# Patient Record
Sex: Female | Born: 1940 | Race: White | Hispanic: No | Marital: Married | Smoking: Current every day smoker
Health system: Southern US, Community
[De-identification: ages and names within clinical notes are randomized; demographics above are authoritative.]

## PROBLEM LIST (undated history)

## (undated) DIAGNOSIS — E039 Hypothyroidism, unspecified: Secondary | ICD-10-CM

## (undated) DIAGNOSIS — Z9981 Dependence on supplemental oxygen: Secondary | ICD-10-CM

## (undated) DIAGNOSIS — J189 Pneumonia, unspecified organism: Secondary | ICD-10-CM

## (undated) DIAGNOSIS — M1990 Unspecified osteoarthritis, unspecified site: Secondary | ICD-10-CM

## (undated) DIAGNOSIS — J449 Chronic obstructive pulmonary disease, unspecified: Secondary | ICD-10-CM

## (undated) DIAGNOSIS — F329 Major depressive disorder, single episode, unspecified: Secondary | ICD-10-CM

## (undated) DIAGNOSIS — F32A Depression, unspecified: Secondary | ICD-10-CM

## (undated) HISTORY — PX: EYE SURGERY: SHX253

## (undated) HISTORY — PX: NASAL SINUS SURGERY: SHX719

## (undated) HISTORY — PX: ABDOMINAL HYSTERECTOMY: SHX81

## (undated) HISTORY — PX: APPENDECTOMY: SHX54

## (undated) HISTORY — PX: BACK SURGERY: SHX140

---

## 1999-01-19 ENCOUNTER — Encounter: Payer: Self-pay | Admitting: Neurological Surgery

## 1999-01-19 ENCOUNTER — Ambulatory Visit (HOSPITAL_COMMUNITY): Admission: RE | Admit: 1999-01-19 | Discharge: 1999-01-19 | Payer: Self-pay | Admitting: Neurological Surgery

## 1999-03-29 ENCOUNTER — Ambulatory Visit (HOSPITAL_COMMUNITY): Admission: RE | Admit: 1999-03-29 | Discharge: 1999-03-29 | Payer: Self-pay | Admitting: Neurological Surgery

## 1999-03-29 ENCOUNTER — Encounter: Payer: Self-pay | Admitting: Neurological Surgery

## 1999-12-13 ENCOUNTER — Encounter: Admission: RE | Admit: 1999-12-13 | Discharge: 1999-12-13 | Payer: Self-pay | Admitting: Neurological Surgery

## 1999-12-13 ENCOUNTER — Encounter: Payer: Self-pay | Admitting: Neurological Surgery

## 1999-12-21 ENCOUNTER — Encounter: Payer: Self-pay | Admitting: Neurological Surgery

## 1999-12-25 ENCOUNTER — Encounter: Payer: Self-pay | Admitting: Neurological Surgery

## 1999-12-25 ENCOUNTER — Inpatient Hospital Stay (HOSPITAL_COMMUNITY): Admission: RE | Admit: 1999-12-25 | Discharge: 1999-12-26 | Payer: Self-pay | Admitting: Neurological Surgery

## 2005-06-07 ENCOUNTER — Emergency Department (HOSPITAL_COMMUNITY): Admission: EM | Admit: 2005-06-07 | Discharge: 2005-06-07 | Payer: Self-pay | Admitting: Emergency Medicine

## 2008-08-28 ENCOUNTER — Ambulatory Visit (HOSPITAL_COMMUNITY): Admission: RE | Admit: 2008-08-28 | Discharge: 2008-08-28 | Payer: Self-pay | Admitting: Orthopedic Surgery

## 2011-02-06 NOTE — Op Note (Signed)
Pamela Guerrero, Pamela Guerrero                 ACCOUNT NO.:  [id number]   MEDICAL RECORD NO.:  [id number]          PATIENT TYPE:  AMB   LOCATION:  SDS                          FACILITY:  [facility]   PHYSICIAN:  Artist Pais. Weingold, M.D.DATE OF BIRTH:  Aug 27, 1941   DATE OF PROCEDURE:  08/28/2008  DATE OF DISCHARGE:                               OPERATIVE REPORT   PREOPERATIVE DIAGNOSIS:  __________.   POSTOPERATIVE DIAGNOSIS:  __________.   PROCEDURE:  Left index finger flexor synovectomy with A1 pulley release.   SURGEON:  Artist Pais. Mina Marble, MD   ASSISTANT:  None.   ANESTHESIA:  Regional.   TOURNIQUET TIME:  35 minutes.   COMPLICATIONS:  None.   DRAINS:  None.   SPECIMEN:  No specimen sent off.   REPORT:  The patient was taken to the operating suite after induction of  adequate regional anesthetic.  Left upper extremity was prepped and  draped in usual sterile fashion.  Once this was done, a Brunner incision  was made from the mid palm to the level of proximal phalanx.  Skin was  incised, flaps were raised accordingly and dissection was carried down  through the flexor sheath.  Neurovascular bundle was identified and  retracted.  There was significant amount of synovitis about the flexor  sheath.  This was debrided.  The A1 pulley was released and the sheath  between the A1 pulley to the PIP flexion crease was debrided of  obstructive synovium.  This was sent to pathology for confirmation.  The  finger was gently manipulatedintofull flexion and full extension.  Wound  was irrigated and loosely closed with a 4-0 nylon suture.  Xeroform,  4x4s, and compressive wrap was applied.  The patient tolerated the  procedure well and taken to recovery in stable fashion.      Artist Pais Mina Marble, M.D.  Electronically Signed     MAW/MEDQ  D:  08/28/2008  T:  08/28/2008  Job:  [id number]

## 2011-02-09 NOTE — H&P (Signed)
[place]. [facility]  Patient:    Pamela, Guerrero                        MRN: [id number] Adm. Date:  [id number] Attending:  Jonne Ply                         History and Physical  ADMISSION DIAGNOSIS:  Cervical spondylosis with radiculopathy.  CHIEF COMPLAINT:  Neck and right upper extremity pain.  HISTORY OF PRESENT ILLNESS:  [name] is a 70 year old individual whom I have seen in the past for difficulties with lumbar radiculopathy, having  undergone lumbar diskectomy and arthrodesis in the past.  She has recovered well from that procedure; however, she has been troubled intermittently with neck, shoulder, and arm pain.  Initially, it was primarily right arm pain, and previously had diagnosed her with lateral epicondylitis, but more on the right side, and this seemed to respond to conservative treatment.  More recently, she has noted that she has pain in her neck and her left arm with dysesthesias into the fingers so severe that she does not feel she can tolerate it.  She was evaluated about a month ago and was advised regarding surgical decompression of bilateral foraminal stenosis at C5-6 and C6-7.  She has had a previous posterior diskectomy in C6-7 for cervical spondylosis and radiculopathy.  Surgery had been contemplated since the myelogram done in June 2000.  She had a previous right cervical laminotomy and diskectomy, which gave her temporary relief of the pain in her neck and right arm that she as had previously.  Currently, she feels that her right arm is weak.  On recent evaluation, it was noted that she had marked limitation of motion in her neck with a positive Spurlings maneuver.  Biceps were weak on the right to 4/5.  Triceps nd intrinsics strength were also weak on the right side to 4/5.  At this point, she is being admitted to undergo surgical decompression at C5-6 and C6-7 via an anterior diskectomy  and arthrodesis.  PAST MEDICAL HISTORY:  The patient has had general health that has been fair. he does have some breathing problems and uses a number of inhalers.  She has a longtime history of smoking.  PAST SURGICAL HISTORY:  The only other surgery has been a lumbar spine surgery,  which was performed in October 1998.  CURRENT MEDICATIONS: 1. Nasonex 2 sprays b.i.d. 2. Flovent 2 puffs b.i.d. 3. Combivent 2 puffs b.i.d. 4. Clonazepam 1 q.h.s. 5. Guaifenesin. 6. Tequin.  ALLERGIES: 1. CODEINE causes hallucinations. 2. DIFLUCAN.  FAMILY HISTORY:  Negative for significant medical problems such as diabetes; high blood pressure; liver, heart, or kidney disease.  SOCIAL HISTORY:  The patient does not drink alcohol.  She smokes a pack of cigarettes a day for a long number of years.  She had been working at [employer] since prior to the time of an injury in 1997.  REVIEW OF SYSTEMS:  Essentially negative.  PHYSICAL EXAMINATION:  GENERAL:  She is an alert, oriented, cooperative individual in no overt distress.  NEUROLOGIC:  Range of motion in her neck reveals that she turns to the right and to the left only 30 degrees and turning to the right tends to aggravate pain. Flexing and axial compression aggravates pain also.  No masses are palpable in the neck; however, there is tenderness in  the supraclavicular fossas on both sides. Motor strength in the upper extremities reveals weakness in the biceps, triceps, and intrinsic on the right to 4/5; deltoid strength is symmetric bilaterally. Sensation is diminished to pin and light touch on the right side compared to the left.  Vibratory sensation is intact.  Deep tendon reflexes are 1+ in both biceps, absent in the triceps, 2+ in the patellae, 1+ in the Achilles.  Babinskis are downgoing.  Cranial nerve examination reveals her pupils are 4 mm, brisk, reactive to light and accommodation.  The extraocular  movements are full.  Face is symmetric to grimace.  Tongue and uvula are in the midline.  Sclerae and conjunctivae are  clear.  LUNGS:  Clear to auscultation.  HEART:  Regular rate and rhythm.  No murmurs are heard.  ABDOMEN:  Soft.  Bowel sounds are positive.  No masses are palpable.  EXTREMITIES:  No cyanosis, clubbing, or edema.  IMPRESSION:  The patient has evidence of spondylitic disease at C5-6 and C6-7 with foraminal stenosis bilaterally at C5-6 and on the right side of C6-7.  She is now being admitted to undergo two-level anterior diskectomy and arthrodesis. DD:  12/25/99 TD:  12/25/99 Job: [id number]

## 2011-02-09 NOTE — Op Note (Signed)
[place]. [facility]  Patient:    Pamela Guerrero, Pamela Guerrero                        MRN: [id number] Proc. Date: 12/25/99 Adm. Date:  [id number] Attending:  Jonne Ply                           Operative Report  PREOPERATIVE DIAGNOSIS:  C5-6 and C6-7 spondylosis with right cervical radiculopathy.  POSTOPERATIVE DIAGNOSIS:  C5-6 and C6-7 spondylosis with right cervical radiculopathy.  PROCEDURE: 1. Anterior cervical diskectomy and arthrodesis C5-6 and C6-7. 2. Allograft arthrodesis with Synthes fixation.  SURGEON:  Stefani Dama, M.D.  ASSISTANT:  Alanson Aly. Roxan Hockey, M.D.  ANESTHESIA:  General endotracheal.  INDICATIONS:  The patient is a 70 year old individual who has had significant neck, shoulder, and right arm pain.  She was found to have marked spondylitic disease at C5-6 and C6-7.  She was advised regarding surgical decompression and stabilization of her neck using a two-level anterior diskectomy and arthrodesis.  DESCRIPTION OF PROCEDURE:  The patient was brought to the operating room and placed on the tablet in the supine position.  After a smooth induction of general endotracheal anesthesia, she was placed in 5 pounds of Holter traction.  The neck was prepped with DuraPrep and draped in a sterile fashion.  A transverse incision was made in the midportion of the neck on the left side and carried down to the  platysma.  The plane between the sternocleidomastoid and the strap muscles was dissected bluntly until the prevertebral space was reached.  The first identifiable disk space was noted to be that of C5-6 on a radiograph.  The longus coli muscle was dissected and a self-retaining Kaspar retractor was placed in the wound. The C5 disk space was then opened up widely using a #15 blade to open the anterior longitudinal ligament, and then a combination of curets and rongeurs to remove oth the anterior ventral osteophytes and the large  portion of the disk.  As the posterior longitudinal ligament was reached, there was noted to be a significant overhang from the inferior margin of the C5 vertebral body.  This was drilled down with the Midas Rex and an A2 bur.  The uncinate processes were removed on either side, removing a large uncinate spur that extended itself out into the foramen,  particularly on the right.  Once this area was decompressed, the common dural tube and the takeoff of the C6 nerve root was identified and protected.  Gelfoam soaked in thrombin was placed over the epidural bleeding veins in this region.  Both sides were decompressed in this fashion.  Then a 7 mm round fibular graft was placed nto the interspace after it was packed with some of the osteophytic bone that had been harvested.  Attention was then turned to C6-7.  A similar procedure was carried out here, again with prominent osteophytes identified in either lateral gutter.  Once the grafts were placed, the traction was removed and the neck was placed in slight flexion.  A 37 mm Synthes plate was then affixed with locking 4 mm x 14 mm self-drilling, self-tapping screws.  Postoperative x-ray identified good position of the hardware. The area was then copiously irrigated with antibiotic irrigating solution. Hemostasis was meticulously obtained from the soft tissues.  Then the platysma as closed with #2-0 Vicryl in an interrupted fashion, #3-0 Vicryl was used  subcuticularly, and DermaBond was placed on the skin.  The patient tolerated the procedure well and was returned to the recovery room n stable condition. DD:  12/25/99 TD:  12/25/99 Job: [id number]

## 2011-02-09 NOTE — Discharge Summary (Signed)
[place]. [facility]  Patient:    Pamela Guerrero, Pamela Guerrero                        MRN: [id number] Adm. Date:  [id number] Disc. Date: [id number] Attending:  Jonne Ply                           Discharge Summary  ADMITTING DIAGNOSIS:  Cervical spondylosis, C5-6 and C6-7, with cervical radiculopathy.  DISCHARGE DIAGNOSIS:  Cervical spondylosis, C5-6 and C6-7, with cervical radiculopathy.  OPERATION:  Anterior cervical diskectomy and arthrodesis, C5-6 and C6-7, structural allograft fixation with Synthes plate, on December 25, 1999.  ADDITIONAL DIAGNOSES: 1. Chronic obstructive pulmonary disease. 2. Pneumonia.  CONDITION ON DISCHARGE:  Improving.  HOSPITAL COURSE:  The patient is a 70 year old individual who has had significant neck, shoulder, and arm pain.  Her spondylitic workup demonstrated that she had marked spondylitic changes at C5-6 and C6-7 with foraminal stenosis.  She was advised regarding surgery and this was performed on December 25, 1999.  She tolerated her procedure well.  Postoperatively, she was being mobilized without difficulties.  However, during her preoperative workup, it was noted that she had evidence of what was felt to be a lung mass in the left lower lobe.  This was worked up further in the postoperative period with a CAT scan of her chest.  CAT scan did not show any discrete masses and did not show any significant adenopathy; however, it was consistent with what was felt to be a pneumonia or an infiltrative process in the left lower lobe.  I discussed the situation at length with Ms. Senft.  I informed her of the findings and my concern that she had an ongoing process of her lung that should be followed up by her primary care physician.  I contacted her primary care physician in the [place] area and made arrangements for her to be seen with her CAT scan that day after her discharge from the hospital.  Her incision has otherwise remained  clean and dry.  She has been ambulatory.  She notes that her swallowing is doing well and she is advised as to postoperative activity.  She is given a prescription for Vicodin as needed for pain and will be seen in the office in three weeks time for further follow-up. DD:  01/18/00 TD:  01/18/00 Job: [id number]

## 2011-06-29 LAB — CBC
HCT: 41.2 % (ref 36.0–46.0)
Hemoglobin: 13.8 g/dL (ref 12.0–15.0)
MCHC: 33.4 g/dL (ref 30.0–36.0)
MCV: 91.8 fL (ref 78.0–100.0)
Platelets: 243 10*3/uL (ref 150–400)
RBC: 4.48 MIL/uL (ref 3.87–5.11)
RDW: 14.1 % (ref 11.5–15.5)
WBC: 8.6 10*3/uL (ref 4.0–10.5)

## 2011-06-29 LAB — BASIC METABOLIC PANEL
BUN: 8 mg/dL (ref 6–23)
CO2: 29 mEq/L (ref 19–32)
Calcium: 9.8 mg/dL (ref 8.4–10.5)
Chloride: 103 mEq/L (ref 96–112)
Creatinine, Ser: 0.88 mg/dL (ref 0.4–1.2)
GFR calc Af Amer: >60 mL/min (ref >60)
GFR calc non Af Amer: >60 mL/min (ref >60)
Glucose, Bld: 85 mg/dL (ref 70–99)
Potassium: 4.3 mEq/L (ref 3.5–5.1)
Sodium: 140 mEq/L (ref 135–145)

## 2013-01-27 ENCOUNTER — Other Ambulatory Visit: Payer: Self-pay | Admitting: Neurological Surgery

## 2013-01-29 ENCOUNTER — Encounter (HOSPITAL_COMMUNITY): Payer: Self-pay | Admitting: Pharmacy Technician

## 2013-02-04 ENCOUNTER — Encounter (HOSPITAL_COMMUNITY)
Admission: RE | Admit: 2013-02-04 | Discharge: 2013-02-04 | Disposition: A | Discharge status: Home / Self Care | Payer: Medicare Other | Source: Ambulatory Visit | Attending: Neurological Surgery | Admitting: Neurological Surgery

## 2013-02-04 ENCOUNTER — Encounter (HOSPITAL_COMMUNITY): Payer: Self-pay

## 2013-02-04 DIAGNOSIS — Z01818 Encounter for other preprocedural examination: Secondary | ICD-10-CM | POA: Insufficient documentation

## 2013-02-04 DIAGNOSIS — Z01812 Encounter for preprocedural laboratory examination: Secondary | ICD-10-CM | POA: Insufficient documentation

## 2013-02-04 HISTORY — DX: Hypothyroidism, unspecified: E03.9

## 2013-02-04 HISTORY — DX: Pneumonia, unspecified organism: J18.9

## 2013-02-04 HISTORY — DX: Chronic obstructive pulmonary disease, unspecified: J44.9

## 2013-02-04 HISTORY — DX: Dependence on supplemental oxygen: Z99.81

## 2013-02-04 HISTORY — DX: Unspecified osteoarthritis, unspecified site: M19.90

## 2013-02-04 HISTORY — DX: Major depressive disorder, single episode, unspecified: F32.9

## 2013-02-04 HISTORY — DX: Depression, unspecified: F32.A

## 2013-02-04 LAB — TYPE AND SCREEN
ABO/RH(D): A POS
Antibody Screen: NEGATIVE

## 2013-02-04 LAB — CBC
Hemoglobin: 12.3 g/dL (ref 12.0–15.0)
MCHC: 32.5 g/dL (ref 30.0–36.0)
RDW: 13.5 % (ref 11.5–15.5)

## 2013-02-04 LAB — BASIC METABOLIC PANEL
Calcium: 9.2 mg/dL (ref 8.4–10.5)
GFR calc Af Amer: >90 mL/min (ref >90)
Glucose, Bld: 79 mg/dL (ref 70–99)
Sodium: 140 mEq/L (ref 135–145)

## 2013-02-04 LAB — SURGICAL PCR SCREEN
MRSA, PCR: NEGATIVE
Staphylococcus aureus: NEGATIVE

## 2013-02-04 NOTE — Pre-Procedure Instructions (Signed)
Pamela Guerrero  02/04/2013   Your procedure is scheduled on:  Friday, May 16th.  Report to [facility] at 5:30AM.  Call this number if you have problems the morning of surgery: [phone]   Remember:   Do not eat food or drink liquids after midnight.    Take these medicines the morning of surgery with A SIP OF WATER: FLUoxetine (PROZAC)levothyroxine (SYNTHROID, LEVOTHROID),  omeprazole (PRILOSEC).  Use inhalers and nasal sprays.  May take if needed:HYDROcodone-acetaminophen [facility])    Do not wear jewelry, make-up or nail polish.  Do not wear lotions, powders, or perfumes. You may wear deodorant.  Do not shave 48 hours prior to surgery.   Do not bring valuables to the hospital.  Contacts, dentures or bridgework may not be worn into surgery.  Leave suitcase in the car. After surgery it may be brought to your room.  For patients admitted to the hospital, checkout time is 11:00 AM the day of discharge.     Special Instructions: Shower using CHG 2 nights before surgery and the night before surgery.  If you shower the day of surgery use CHG.  Use special wash - you have one bottle of CHG for all showers.  You should use approximately 1/3 of the bottle for each shower.   Please read over the following fact sheets that you were given: Pain Booklet, Coughing and Deep Breathing, Blood Transfusion Information and Surgical Site Infection Prevention

## 2013-02-05 MED ORDER — CEFAZOLIN SODIUM-DEXTROSE 2-3 GM-% IV SOLR: 2.0000 g | INTRAVENOUS | Status: AC

## 2013-02-12 ENCOUNTER — Encounter (HOSPITAL_COMMUNITY): Admission: RE | Admit: 2013-02-12 | Payer: Medicare Other | Source: Ambulatory Visit

## 2013-02-13 ENCOUNTER — Encounter (HOSPITAL_COMMUNITY): Payer: Self-pay | Admitting: Anesthesiology

## 2013-02-13 ENCOUNTER — Ambulatory Visit (HOSPITAL_COMMUNITY): Payer: Medicare Other

## 2013-02-13 ENCOUNTER — Encounter (HOSPITAL_COMMUNITY)
Admission: RE | Disposition: A | Discharge status: Home / Self Care | Payer: Self-pay | Source: Ambulatory Visit | Attending: Neurological Surgery

## 2013-02-13 ENCOUNTER — Inpatient Hospital Stay (HOSPITAL_COMMUNITY)
Admission: RE | Admit: 2013-02-13 | Discharge: 2013-02-16 | DRG: 460 | Disposition: A | Discharge status: Home / Self Care | Payer: Medicare Other | Source: Ambulatory Visit | Attending: Neurological Surgery | Admitting: Neurological Surgery

## 2013-02-13 ENCOUNTER — Encounter (HOSPITAL_COMMUNITY): Payer: Self-pay | Admitting: *Deleted

## 2013-02-13 DIAGNOSIS — G9741 Accidental puncture or laceration of dura during a procedure: Secondary | ICD-10-CM | POA: Diagnosis present

## 2013-02-13 DIAGNOSIS — M47817 Spondylosis without myelopathy or radiculopathy, lumbosacral region: Principal | ICD-10-CM | POA: Diagnosis present

## 2013-02-13 DIAGNOSIS — M216X9 Other acquired deformities of unspecified foot: Secondary | ICD-10-CM | POA: Diagnosis present

## 2013-02-13 DIAGNOSIS — IMO0002 Reserved for concepts with insufficient information to code with codable children: Secondary | ICD-10-CM | POA: Diagnosis present

## 2013-02-13 DIAGNOSIS — F17200 Nicotine dependence, unspecified, uncomplicated: Secondary | ICD-10-CM | POA: Diagnosis present

## 2013-02-13 DIAGNOSIS — M48061 Spinal stenosis, lumbar region without neurogenic claudication: Secondary | ICD-10-CM | POA: Diagnosis present

## 2013-02-13 DIAGNOSIS — R5082 Postprocedural fever: Secondary | ICD-10-CM | POA: Diagnosis not present

## 2013-02-13 DIAGNOSIS — J449 Chronic obstructive pulmonary disease, unspecified: Secondary | ICD-10-CM | POA: Diagnosis present

## 2013-02-13 DIAGNOSIS — J4489 Other specified chronic obstructive pulmonary disease: Secondary | ICD-10-CM | POA: Diagnosis present

## 2013-02-13 DIAGNOSIS — Z87891 Personal history of nicotine dependence: Secondary | ICD-10-CM

## 2013-02-13 DIAGNOSIS — M4310 Spondylolisthesis, site unspecified: Secondary | ICD-10-CM | POA: Diagnosis present

## 2013-02-13 LAB — POCT I-STAT 4, (NA,K, GLUC, HGB,HCT)
Glucose, Bld: 87 mg/dL (ref 70–99)
HCT: 27 % — ABNORMAL LOW (ref 36.0–46.0)
Potassium: 3.9 mEq/L (ref 3.5–5.1)

## 2013-02-13 SURGERY — POSTERIOR LUMBAR FUSION 2 LEVEL
Anesthesia: General | Site: Back

## 2013-02-13 MED ORDER — SODIUM CHLORIDE 0.9 % IR SOLN
Status: DC | PRN
  Administered 2013-02-13: 17:00:00

## 2013-02-13 MED ORDER — BISACODYL 10 MG RE SUPP: 10.0000 mg | Freq: Every day | RECTAL | Status: DC | PRN

## 2013-02-13 MED ORDER — OXYCODONE HCL 5 MG/5ML PO SOLN: 5.0000 mg | Freq: Once | ORAL | Status: DC | PRN

## 2013-02-13 MED ORDER — 0.9 % SODIUM CHLORIDE (POUR BTL) OPTIME
TOPICAL | Status: DC | PRN
  Administered 2013-02-13: 1000 mL

## 2013-02-13 MED ORDER — LEVOTHYROXINE SODIUM 50 MCG PO TABS
50.0000 ug | ORAL_TABLET | Freq: Every day | ORAL | Status: DC
  Administered 2013-02-14 – 2013-02-16 (×3): 50 ug via ORAL
  Filled 2013-02-13 (×4): qty 1

## 2013-02-13 MED ORDER — ACETAMINOPHEN 325 MG PO TABS: 650.0000 mg | ORAL_TABLET | ORAL | Status: DC | PRN

## 2013-02-13 MED ORDER — MENTHOL 3 MG MT LOZG
1.0000 | LOZENGE | OROMUCOSAL | Status: DC | PRN
  Administered 2013-02-15: 3 mg via ORAL
  Filled 2013-02-13: qty 9

## 2013-02-13 MED ORDER — GLYCOPYRROLATE 0.2 MG/ML IJ SOLN
INTRAMUSCULAR | Status: DC | PRN
  Administered 2013-02-13: .4 mg via INTRAVENOUS

## 2013-02-13 MED ORDER — ALPRAZOLAM ER 1 MG PO TB24
1.0000 mg | ORAL_TABLET | Freq: Every day | ORAL | Status: DC
Start: 2013-02-13 — End: 2013-02-13

## 2013-02-13 MED ORDER — ALPRAZOLAM 0.5 MG PO TABS
0.5000 mg | ORAL_TABLET | Freq: Two times a day (BID) | ORAL | Status: DC
  Administered 2013-02-13 – 2013-02-16 (×6): 0.5 mg via ORAL
  Filled 2013-02-13 (×6): qty 1

## 2013-02-13 MED ORDER — ALBUMIN HUMAN 5 % IV SOLN
INTRAVENOUS | Status: DC | PRN
  Administered 2013-02-13 (×2): via INTRAVENOUS

## 2013-02-13 MED ORDER — BUPIVACAINE HCL (PF) 0.5 % IJ SOLN
INTRAMUSCULAR | Status: DC | PRN
  Administered 2013-02-13: 5 mL
  Administered 2013-02-13: 20 mL

## 2013-02-13 MED ORDER — EPHEDRINE SULFATE 50 MG/ML IJ SOLN
INTRAMUSCULAR | Status: DC | PRN
  Administered 2013-02-13 (×2): 5 mg via INTRAVENOUS
  Administered 2013-02-13: 15 mg via INTRAVENOUS
  Administered 2013-02-13: 5 mg via INTRAVENOUS

## 2013-02-13 MED ORDER — ALUM & MAG HYDROXIDE-SIMETH 200-200-20 MG/5ML PO SUSP
30.0000 mL | Freq: Four times a day (QID) | ORAL | Status: DC | PRN
  Administered 2013-02-15: 30 mL via ORAL
  Filled 2013-02-13: qty 30

## 2013-02-13 MED ORDER — NON FORMULARY: Freq: Two times a day (BID) | Status: DC

## 2013-02-13 MED ORDER — DOCUSATE SODIUM 100 MG PO CAPS
100.0000 mg | ORAL_CAPSULE | Freq: Two times a day (BID) | ORAL | Status: DC
  Administered 2013-02-13 – 2013-02-16 (×5): 100 mg via ORAL
  Filled 2013-02-13 (×7): qty 1

## 2013-02-13 MED ORDER — ROCURONIUM BROMIDE 100 MG/10ML IV SOLN
INTRAVENOUS | Status: DC | PRN
  Administered 2013-02-13: 50 mg via INTRAVENOUS

## 2013-02-13 MED ORDER — PHENOL 1.4 % MT LIQD: 1.0000 | OROMUCOSAL | Status: DC | PRN

## 2013-02-13 MED ORDER — LACTATED RINGERS IV SOLN
INTRAVENOUS | Status: DC | PRN
  Administered 2013-02-13 (×4): via INTRAVENOUS

## 2013-02-13 MED ORDER — FLEET ENEMA 7-19 GM/118ML RE ENEM
1.0000 | ENEMA | Freq: Once | RECTAL | Status: AC | PRN
  Filled 2013-02-13: qty 1

## 2013-02-13 MED ORDER — PANTOPRAZOLE SODIUM 40 MG PO TBEC
40.0000 mg | DELAYED_RELEASE_TABLET | Freq: Every day | ORAL | Status: DC
  Administered 2013-02-14 – 2013-02-16 (×3): 40 mg via ORAL
  Filled 2013-02-13 (×3): qty 1

## 2013-02-13 MED ORDER — THROMBIN 20000 UNITS EX SOLR
CUTANEOUS | Status: DC | PRN
  Administered 2013-02-13: 17:00:00 via TOPICAL

## 2013-02-13 MED ORDER — ONDANSETRON HCL 4 MG/2ML IJ SOLN
4.0000 mg | INTRAMUSCULAR | Status: DC | PRN
  Administered 2013-02-14: 4 mg via INTRAVENOUS
  Filled 2013-02-13: qty 2

## 2013-02-13 MED ORDER — METHOCARBAMOL 500 MG PO TABS
500.0000 mg | ORAL_TABLET | Freq: Four times a day (QID) | ORAL | Status: DC | PRN
  Administered 2013-02-16: 500 mg via ORAL
  Filled 2013-02-13 (×4): qty 1

## 2013-02-13 MED ORDER — ONDANSETRON HCL 4 MG/2ML IJ SOLN
INTRAMUSCULAR | Status: DC | PRN
  Administered 2013-02-13: 4 mg via INTRAVENOUS

## 2013-02-13 MED ORDER — CEFAZOLIN SODIUM 1-5 GM-% IV SOLN
INTRAVENOUS | Status: AC
  Filled 2013-02-13: qty 50

## 2013-02-13 MED ORDER — OXYCODONE-ACETAMINOPHEN 5-325 MG PO TABS
1.0000 | ORAL_TABLET | ORAL | Status: DC | PRN
  Administered 2013-02-14 – 2013-02-15 (×4): 2 via ORAL
  Administered 2013-02-15: 1 via ORAL
  Administered 2013-02-16: 2 via ORAL
  Filled 2013-02-13: qty 1
  Filled 2013-02-13 (×5): qty 2

## 2013-02-13 MED ORDER — METHOCARBAMOL 100 MG/ML IJ SOLN
500.0000 mg | Freq: Four times a day (QID) | INTRAVENOUS | Status: DC | PRN
  Filled 2013-02-13: qty 5

## 2013-02-13 MED ORDER — PROPOFOL 10 MG/ML IV BOLUS
INTRAVENOUS | Status: DC | PRN
  Administered 2013-02-13: 140 mg via INTRAVENOUS

## 2013-02-13 MED ORDER — LIDOCAINE-EPINEPHRINE 1 %-1:100000 IJ SOLN
INTRAMUSCULAR | Status: DC | PRN
  Administered 2013-02-13: 5 mL

## 2013-02-13 MED ORDER — HYDROMORPHONE HCL PF 1 MG/ML IJ SOLN: 0.2500 mg | INTRAMUSCULAR | Status: DC | PRN

## 2013-02-13 MED ORDER — MIDAZOLAM HCL 5 MG/5ML IJ SOLN
INTRAMUSCULAR | Status: DC | PRN
  Administered 2013-02-13: 2 mg via INTRAVENOUS

## 2013-02-13 MED ORDER — FENTANYL CITRATE 0.05 MG/ML IJ SOLN
INTRAMUSCULAR | Status: DC | PRN
  Administered 2013-02-13 (×2): 50 ug via INTRAVENOUS
  Administered 2013-02-13: 100 ug via INTRAVENOUS
  Administered 2013-02-13 (×2): 75 ug via INTRAVENOUS
  Administered 2013-02-13: 100 ug via INTRAVENOUS

## 2013-02-13 MED ORDER — HYDROCODONE-ACETAMINOPHEN 10-325 MG PO TABS
1.0000 | ORAL_TABLET | Freq: Every day | ORAL | Status: DC
  Administered 2013-02-13 – 2013-02-15 (×3): 1 via ORAL
  Filled 2013-02-13 (×3): qty 1

## 2013-02-13 MED ORDER — CEFAZOLIN SODIUM 1-5 GM-% IV SOLN
1.0000 g | Freq: Three times a day (TID) | INTRAVENOUS | Status: AC
  Administered 2013-02-14 (×2): 1 g via INTRAVENOUS
  Filled 2013-02-13 (×2): qty 50

## 2013-02-13 MED ORDER — OXYCODONE HCL 5 MG PO TABS: 5.0000 mg | ORAL_TABLET | Freq: Once | ORAL | Status: DC | PRN

## 2013-02-13 MED ORDER — CEFAZOLIN SODIUM-DEXTROSE 2-3 GM-% IV SOLR
INTRAVENOUS | Status: AC
  Administered 2013-02-13: 2 g via INTRAVENOUS
  Administered 2013-02-13: 1 g via INTRAVENOUS
  Filled 2013-02-13: qty 50

## 2013-02-13 MED ORDER — MORPHINE SULFATE 2 MG/ML IJ SOLN: 1.0000 mg | INTRAMUSCULAR | Status: DC | PRN

## 2013-02-13 MED ORDER — POLYETHYLENE GLYCOL 3350 17 G PO PACK
17.0000 g | PACK | Freq: Every day | ORAL | Status: DC | PRN
  Filled 2013-02-13: qty 1

## 2013-02-13 MED ORDER — NEOSTIGMINE METHYLSULFATE 1 MG/ML IJ SOLN
INTRAMUSCULAR | Status: DC | PRN
  Administered 2013-02-13: 3 mg via INTRAVENOUS

## 2013-02-13 MED ORDER — FLUOXETINE HCL 20 MG PO CAPS
40.0000 mg | ORAL_CAPSULE | Freq: Every day | ORAL | Status: DC
  Administered 2013-02-14 – 2013-02-16 (×3): 40 mg via ORAL
  Filled 2013-02-13 (×3): qty 2

## 2013-02-13 MED ORDER — BACITRACIN 50000 UNITS IM SOLR
INTRAMUSCULAR | Status: AC
  Filled 2013-02-13: qty 1

## 2013-02-13 MED ORDER — CLONAZEPAM 1 MG PO TABS
2.0000 mg | ORAL_TABLET | Freq: Every day | ORAL | Status: DC
  Administered 2013-02-14 – 2013-02-15 (×2): 2 mg via ORAL
  Filled 2013-02-13 (×2): qty 2

## 2013-02-13 MED ORDER — FLUTICASONE PROPIONATE 50 MCG/ACT NA SUSP
1.0000 | Freq: Every day | NASAL | Status: DC
  Administered 2013-02-14 – 2013-02-16 (×3): 1 via NASAL
  Filled 2013-02-13 (×2): qty 16

## 2013-02-13 MED ORDER — SODIUM CHLORIDE 0.9 % IJ SOLN
3.0000 mL | INTRAMUSCULAR | Status: DC | PRN
  Administered 2013-02-13: 3 mL via INTRAVENOUS

## 2013-02-13 MED ORDER — SENNA 8.6 MG PO TABS
1.0000 | ORAL_TABLET | Freq: Two times a day (BID) | ORAL | Status: DC
  Administered 2013-02-13 – 2013-02-16 (×5): 8.6 mg via ORAL
  Filled 2013-02-13 (×7): qty 1

## 2013-02-13 MED ORDER — LIDOCAINE HCL (CARDIAC) 20 MG/ML IV SOLN
INTRAVENOUS | Status: DC | PRN
  Administered 2013-02-13: 60 mg via INTRAVENOUS

## 2013-02-13 MED ORDER — SODIUM CHLORIDE 0.9 % IJ SOLN
3.0000 mL | Freq: Two times a day (BID) | INTRAMUSCULAR | Status: DC
  Administered 2013-02-13 – 2013-02-15 (×4): 3 mL via INTRAVENOUS

## 2013-02-13 MED ORDER — ACETAMINOPHEN 650 MG RE SUPP: 650.0000 mg | RECTAL | Status: DC | PRN

## 2013-02-13 MED ORDER — ONDANSETRON HCL 4 MG/2ML IJ SOLN: 4.0000 mg | Freq: Four times a day (QID) | INTRAMUSCULAR | Status: DC | PRN

## 2013-02-13 MED ORDER — SIMVASTATIN 40 MG PO TABS
40.0000 mg | ORAL_TABLET | Freq: Every day | ORAL | Status: DC
  Administered 2013-02-14 – 2013-02-15 (×2): 40 mg via ORAL
  Filled 2013-02-13 (×4): qty 1

## 2013-02-13 MED ORDER — SODIUM CHLORIDE 0.9 % IV SOLN
INTRAVENOUS | Status: AC
  Filled 2013-02-13: qty 500

## 2013-02-13 MED ORDER — SODIUM CHLORIDE 0.9 % IV SOLN
250.0000 mL | INTRAVENOUS | Status: DC
  Administered 2013-02-14: 250 mL via INTRAVENOUS

## 2013-02-13 SURGICAL SUPPLY — 73 items
ADH SKN CLS APL DERMABOND .7 (GAUZE/BANDAGES/DRESSINGS) ×1
BAG DECANTER FOR FLEXI CONT (MISCELLANEOUS) ×2 IMPLANT
BLADE SURG ROTATE 9660 (MISCELLANEOUS) IMPLANT
BUR MATCHSTICK NEURO 3.0 LAGG (BURR) ×2 IMPLANT
CAGE 11MM (Cage) ×2 IMPLANT
CANISTER SUCTION 2500CC (MISCELLANEOUS) ×2 IMPLANT
CLOTH BEACON ORANGE TIMEOUT ST (SAFETY) ×2 IMPLANT
CONT SPEC 4OZ CLIKSEAL STRL BL (MISCELLANEOUS) ×4 IMPLANT
COVER BACK TABLE 24X17X13 BIG (DRAPES) IMPLANT
COVER TABLE BACK 60X90 (DRAPES) ×2 IMPLANT
CROSSLINK MEDIUM (Cage) ×1 IMPLANT
DECANTER SPIKE VIAL GLASS SM (MISCELLANEOUS) ×2 IMPLANT
DERMABOND ADVANCED (GAUZE/BANDAGES/DRESSINGS) ×1
DERMABOND ADVANCED .7 DNX12 (GAUZE/BANDAGES/DRESSINGS) ×1 IMPLANT
DRAPE C-ARM 42X72 X-RAY (DRAPES) ×4 IMPLANT
DRAPE LAPAROTOMY 100X72X124 (DRAPES) ×2 IMPLANT
DRAPE POUCH INSTRU U-SHP 10X18 (DRAPES) ×2 IMPLANT
DRAPE PROXIMA HALF (DRAPES) ×3 IMPLANT
DURAPREP 26ML APPLICATOR (WOUND CARE) ×2 IMPLANT
ELECT REM PT RETURN 9FT ADLT (ELECTROSURGICAL) ×2
ELECTRODE REM PT RTRN 9FT ADLT (ELECTROSURGICAL) ×1 IMPLANT
GAUZE SPONGE 4X4 16PLY XRAY LF (GAUZE/BANDAGES/DRESSINGS) IMPLANT
GLOVE BIO SURGEON STRL SZ7 (GLOVE) ×2 IMPLANT
GLOVE BIOGEL M 8.0 STRL (GLOVE) ×2 IMPLANT
GLOVE BIOGEL PI IND STRL 7.0 (GLOVE) IMPLANT
GLOVE BIOGEL PI IND STRL 7.5 (GLOVE) IMPLANT
GLOVE BIOGEL PI IND STRL 8.5 (GLOVE) ×2 IMPLANT
GLOVE BIOGEL PI INDICATOR 7.0 (GLOVE) ×2
GLOVE BIOGEL PI INDICATOR 7.5 (GLOVE) ×1
GLOVE BIOGEL PI INDICATOR 8.5 (GLOVE) ×4
GLOVE ECLIPSE 8.5 STRL (GLOVE) ×5 IMPLANT
GLOVE EXAM NITRILE LRG STRL (GLOVE) IMPLANT
GLOVE EXAM NITRILE MD LF STRL (GLOVE) IMPLANT
GLOVE EXAM NITRILE XL STR (GLOVE) IMPLANT
GLOVE EXAM NITRILE XS STR PU (GLOVE) IMPLANT
GLOVE SURG SS PI 7.0 STRL IVOR (GLOVE) ×2 IMPLANT
GLOVE SURG SS PI 8.0 STRL IVOR (GLOVE) ×3 IMPLANT
GOWN BRE IMP SLV AUR LG STRL (GOWN DISPOSABLE) ×1 IMPLANT
GOWN BRE IMP SLV AUR XL STRL (GOWN DISPOSABLE) ×3 IMPLANT
GOWN STRL REIN 2XL LVL4 (GOWN DISPOSABLE) ×5 IMPLANT
KIT BASIN OR (CUSTOM PROCEDURE TRAY) ×2 IMPLANT
KIT ROOM TURNOVER OR (KITS) ×2 IMPLANT
MILL MEDIUM DISP (BLADE) ×1 IMPLANT
NDL SPNL 18GX3.5 QUINCKE PK (NEEDLE) IMPLANT
NEEDLE HYPO 22GX1.5 SAFETY (NEEDLE) ×2 IMPLANT
NEEDLE SPNL 18GX3.5 QUINCKE PK (NEEDLE) ×2 IMPLANT
NS IRRIG 1000ML POUR BTL (IV SOLUTION) ×2 IMPLANT
PACK FOAM VITOSS 10CC (Orthopedic Implant) ×1 IMPLANT
PACK LAMINECTOMY NEURO (CUSTOM PROCEDURE TRAY) ×2 IMPLANT
PAD ARMBOARD 7.5X6 YLW CONV (MISCELLANEOUS) ×8 IMPLANT
PATTIES SURGICAL .5 X.5 (GAUZE/BANDAGES/DRESSINGS) ×1 IMPLANT
PATTIES SURGICAL .5 X1 (DISPOSABLE) ×3 IMPLANT
PEEK PLIF NOVEL 9X25X12 (Peek) ×2 IMPLANT
ROD 70MM (Rod) ×1 IMPLANT
SCREW 40MM (Screw) ×6 IMPLANT
SCREW SET (Screw) ×6 IMPLANT
SPONGE GAUZE 4X4 12PLY (GAUZE/BANDAGES/DRESSINGS) ×1 IMPLANT
SPONGE LAP 4X18 X RAY DECT (DISPOSABLE) IMPLANT
SPONGE SURGIFOAM ABS GEL 100 (HEMOSTASIS) ×2 IMPLANT
SUT PROLENE 6 0 BV (SUTURE) ×2 IMPLANT
SUT VIC AB 1 CT1 18XBRD ANBCTR (SUTURE) ×1 IMPLANT
SUT VIC AB 1 CT1 8-18 (SUTURE) ×4
SUT VIC AB 2-0 CP2 18 (SUTURE) ×3 IMPLANT
SUT VIC AB 3-0 SH 8-18 (SUTURE) ×2 IMPLANT
SYR 20ML ECCENTRIC (SYRINGE) ×2 IMPLANT
SYR 3ML LL SCALE MARK (SYRINGE) ×4 IMPLANT
SYR 5ML LL (SYRINGE) IMPLANT
TAPE CLOTH SURG 4X10 WHT LF (GAUZE/BANDAGES/DRESSINGS) ×1 IMPLANT
TOWEL OR 17X24 6PK STRL BLUE (TOWEL DISPOSABLE) ×2 IMPLANT
TOWEL OR 17X26 10 PK STRL BLUE (TOWEL DISPOSABLE) ×2 IMPLANT
TRAP SPECIMEN MUCOUS 40CC (MISCELLANEOUS) ×2 IMPLANT
TRAY FOLEY CATH 14FRSI W/METER (CATHETERS) ×2 IMPLANT
WATER STERILE IRR 1000ML POUR (IV SOLUTION) ×2 IMPLANT

## 2013-02-13 NOTE — Transfer of Care (Signed)
Immediate Anesthesia Transfer of Care Note  Patient: Omesha M Revell  Procedure(s) Performed: Procedure(s) with comments: POSTERIOR LUMBAR FUSION 2 LEVEL (N/A) - L3-4 L4-5 Decompression of L3-L5 Nerve roots, Posterior lumbar interbody fusion L3-4 L4-5, Pedicle screws  Patient Location: PACU  Anesthesia Type:General  Level of Consciousness: awake  Airway & Oxygen Therapy: Patient Spontanous Breathing and Patient connected to face mask oxygen  Post-op Assessment: Report given to PACU RN and Post -op Vital signs reviewed and stable  Post vital signs: Reviewed and stable  Complications: No apparent anesthesia complications

## 2013-02-13 NOTE — H&P (Signed)
ZO:XWRU and leg pain  HISTORY OF PRESENT ILLNESS:  Pamela Guerrero returns to the office today for evaluation of worsening back pain and leg weakness.  I have seen and treated Ms. Lindalou Pacha in years gone by.  About 15 years ago, I did a decompression and fusion at L5-S1, secondary to a degenerative spondylolisthesis.  She recovered well from that surgery.  However, she notes that in the past year particularly, she has developed weakness in the left leg and pain in the back and the right lower extremity.  She notes that the pain is quite severe, radiating down the leg and into the foot.  Because her left leg is weak, she walks with the use of a cane.  This past winter, she was particularly troubled with the pain and discomfort as she was trying to work on moving and cutting wood and moving a Therapist, occupational.  She notes that seemed to aggravate the symptoms considerably.  Ms. Hooser maintains a very active lifestyle.  She tells me that she has some COPD.  She has been trying to quit smoking, but overall,  her medical conditions have not thwarted her level of activity.   CURRENT MEDICATIONS:  Include Nasonex, Advair Diskus, Combivent, Fluoxetine, Levothyroxine, and Clonazepam.  PERSONAL HISTORY:   Reveals that she still smokes some, although she is trying to quit.  She does not drink alcohol.    REVIEW OF SYSTEMS:   Notable for wearing of glasses, bronchitis, balance disturbance, nasal congestion and drainage, sinus problems, leg pain while walking, leg weakness, back pain, arm pain, hormone difficulties, thyroid disease, inhalant allergies, depression and anxiety, and some inability to concentrate, and some difficulty with her memory.    PHYSICAL EXAMINATION:  Height and weight have been stable at 126 pounds, 5 feet 7.5 inches.  I note that she has a high-grade footdrop on that left side with tibialis anterior strength of 1/5.  Extensor hallucis longus is 2/5.  Her right-sided strength appears good and intact.  Deep  tendon reflexes are absent in the patellae bilaterally, trace in the Achilles bilaterally.  Sensation is diminished in the vibratory region on the right side compared to the left.  DATA:     Today in the office, I obtained a new AP and lateral radiograph of the lumbar spine in an effort to elucidate the nature of any change in her back since her MRI in 08/2012.  Two views are analyzed.  AP view demonstrates that she has collapse of the disc space at the level of L3-4 and L4-5 with some moderate facet arthropathy at each of these levels.  Ray cage arthrodesis is noted at the L5-S1 level in good position.  Lateral view demonstrates that there is spondylitic degeneration at L3-4 and L4-5.  Alignment is good in both the coronal and sagittal planes.  The impression on the x-ray is that the patient has advancing spondylosis at L3-4 and L4-5, Ray cage arthrodesis at L5-S1.    IMPRESSION/PLAN:  I indicated to [facility] that the best treatment would be to proceed with surgical decompression and stabilization of L3-4 and L4-5.  I do not believe that simple decompression would be appropriate or adequate, as she has advanced stenosis at the L3-4 level, secondary to a combination of marked facet overgrowth and disc degeneration.  L4-5 has similar findings with particularly left-sided foraminal stenosis, which would explain her footdrop.  Adequate decompression of the L4 and L5 nerve roots is going to require surgical stabilization.  We  will plan on scheduling surgery at the earliest convenience.  She is admitted for surgery today.Marland Kitchen

## 2013-02-13 NOTE — Progress Notes (Signed)
Patient ID: Pamela Guerrero, female   DOB: 1940-12-03, 72 y.o.   MRN: [id number] Slight weakness and left tibialis anterior compared to the right greater 4/5. Dressing is dry. Patient is awake and alert otherwise. Stable postop

## 2013-02-13 NOTE — Preoperative (Signed)
Beta Blockers   Reason not to administer Beta Blockers:Not Applicable 

## 2013-02-13 NOTE — Anesthesia Procedure Notes (Signed)
Procedure Name: Intubation Date/Time: 02/13/2013 4:04 PM Performed by: Alanda Amass A Pre-anesthesia Checklist: Patient identified, Timeout performed, Emergency Drugs available, Suction available and Patient being monitored Patient Re-evaluated:Patient Re-evaluated prior to inductionOxygen Delivery Method: Circle system utilized Preoxygenation: Pre-oxygenation with 100% oxygen Intubation Type: IV induction Ventilation: Mask ventilation without difficulty Laryngoscope Size: Mac and 3 Grade View: Grade I Tube type: Oral Tube size: 7.5 mm Number of attempts: 1 Airway Equipment and Method: Stylet Placement Confirmation: ETT inserted through vocal cords under direct vision,  breath sounds checked- equal and bilateral and positive ETCO2 Secured at: 19 cm Tube secured with: Tape Dental Injury: Teeth and Oropharynx as per pre-operative assessment

## 2013-02-13 NOTE — Anesthesia Postprocedure Evaluation (Signed)
  Anesthesia Post-op Note  Patient: Pamela Guerrero  Procedure(s) Performed: Procedure(s) with comments: POSTERIOR LUMBAR FUSION 2 LEVEL (N/A) - L3-4 L4-5 Decompression of L3-L5 Nerve roots, Posterior lumbar interbody fusion L3-4 L4-5, Pedicle screws  Patient Location: PACU  Anesthesia Type:General  Level of Consciousness: awake, alert , oriented and patient cooperative  Airway and Oxygen Therapy: Patient Spontanous Breathing and Patient connected to nasal cannula oxygen  Post-op Pain: none  Post-op Assessment: Post-op Vital signs reviewed, Patient's Cardiovascular Status Stable, Respiratory Function Stable, Patent Airway, No signs of Nausea or vomiting and Pain level controlled  Post-op Vital Signs: Reviewed and stable  Complications: No apparent anesthesia complications

## 2013-02-13 NOTE — Anesthesia Preprocedure Evaluation (Addendum)
Anesthesia Evaluation  Patient identified by MRN, date of birth, ID band Patient awake    Reviewed: Allergy & Precautions, H&P , NPO status , Patient's Chart, lab work & pertinent test results  Airway Mallampati: II TM Distance: >3 FB Neck ROM: full    Dental  (+) Dental Advisory Given   Pulmonary COPDCurrent Smoker,          Cardiovascular     Neuro/Psych Depression    GI/Hepatic   Endo/Other  Hypothyroidism   Renal/GU      Musculoskeletal  (+) Arthritis -,   Abdominal   Peds  Hematology   Anesthesia Other Findings   Reproductive/Obstetrics                          Anesthesia Physical Anesthesia Plan  ASA: III  Anesthesia Plan: General   Post-op Pain Management:    Induction: Intravenous  Airway Management Planned: Oral ETT  Additional Equipment:   Intra-op Plan:   Post-operative Plan: Extubation in OR  Informed Consent: I have reviewed the patients History and Physical, chart, labs and discussed the procedure including the risks, benefits and alternatives for the proposed anesthesia with the patient or authorized representative who has indicated his/her understanding and acceptance.     Plan Discussed with: CRNA, Anesthesiologist and Surgeon  Anesthesia Plan Comments:         Anesthesia Quick Evaluation

## 2013-02-13 NOTE — Op Note (Signed)
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 Transverse connector.  Surgeon: Barnett Abu First assistant: Marikay Alar Anesthesia: Gen. endotracheal Indications: Patient is a 72 year old individual who about 11 years ago underwent surgical decompression and arthrodesis at L5-S1 secondary to severe spondylosis and stenosis she had a ray cage fusion. She did well. In the last year she's developed progressive back pain and leg pain. A recent MRI in December demonstrates the presence of severe spondylitic stenosis at L3-4 and L4-5. She was advised and tried on some conservative treatments which did nothing. She has had progressive and profound leg pain and weakness such that her stamina on her feet has deteriorated severely. She is advised regarding surgical decompression and stabilization.  Procedure: The patient was brought to the operating room supine on a stretcher. After the smooth induction of general endotracheal anesthesia, she was turned prone. The bony prominences were appropriately padded and protected. The back was prepped with alcohol, and DuraPrep. His draped sterilely. A midline incision was created in the lower lumbar spine and carried down to the lumbar dorsal fascia. Subperiosteal dissection was performed around the immediately present spinous processes and lamina which were then identified radiographically as L4 and L3.  The dissection was carried out over the facet joints and the transverse processes of L3-L4 and L5 were decorticated and intact with gauze sponges were later arthrodesis. A complete laminectomy of L4 was completed L3 hemilaminectomy also removing the entirety of the facet joints and L5 was then substantially undercut. While doing the laminectomy lung L5 in inadvertent durotomy occurred. This was closed with 2 6-0 Prolene sutures. No spinal fluid leakage was identified. The L5 nerve roots were noted to be severely stenotic in a travel towards the foramen secondary to a combination of lateral recess stenosis and hypertrophy of the laminar arch of L5. This nerve root was decompressed first on the right than on the left the L4 nerve roots similarly had substantial stenosis from redundant yellow ligament this material and these were individually decompressed using a high-speed drill and a 2 and 3 mm Kerrison punch the L3 nerve roots were similarly decompressed of severe stenosis as a travel towards their foramen. Once the decompression was accomplished the disc spaces were isolated. Discectomies were performed at L4-5 bilaterally removing the entirety of the disc from within the disc space. The endplates were decorticated. After adequate preparation interspace was sized it was felt that a 12 mm peek spacer would fit well and we create some lordosis. 2 peek spacers were packed with the cost. The autograft that was harvested from the laminar arch and the facet joints was then prepared by grinding through a bone mill and then packing into treatment cc syringes. 6 cc of bone was packed into the interspace along with the 2 cages. Attention was turned to the L3-L4 or similarly a total discectomy was completed. Here a 10 mm spacer could be placed into the interspace and provided adequate distraction when the endplates were completely prepared, the interspace was packed with 211 mm peek spacers and another 6 cc of  autograft.  Next attention was turned to pedicle screw placement this was done using fluoroscopic guidance and at every level that is L3-L4 and L5 6.5 x 40 mm pedicle screws were placed first using a probe to probe the pedicle then tapping to 6.5 mm size and then using a ball-tipped probe which are that there was no cut out. Once the screws were placed 70 mm straight rod through used to connect the screw heads together. A transverse connector was placed. Prior to finishing the construct and placing the screws the lateral gutters were packed with autograft and allograft that had been saved and harvested from the laminectomy. The screws in place the hardware being tightened and torqued to the final position final radiographs were obtained. Good anatomic alignment with preservation of lordosis and lumbar spine was achieved. The wound was irrigated with antibiotic irrigating solution, then the lumbar dorsal fascia was closed with #1 Vicryl in interrupted fashion. 20 cc of half percent Marcaine was injected into the paraspinous fascia. 2-0 Vicryl was used in the subcutaneous tissues and 3-0 Vicryl subcuticularly. Blood loss for the procedure was 900 cc, 250 cc of Cell Saver blood was returned to the patient.

## 2013-02-14 LAB — BASIC METABOLIC PANEL WITH GFR
BUN: 9 mg/dL (ref 6–23)
CO2: 26 meq/L (ref 19–32)
Calcium: 8.5 mg/dL (ref 8.4–10.5)
Chloride: 101 meq/L (ref 96–112)
Creatinine, Ser: 0.62 mg/dL (ref 0.50–1.10)
GFR calc Af Amer: >90 mL/min
GFR calc non Af Amer: 89 mL/min — ABNORMAL LOW
Glucose, Bld: 95 mg/dL (ref 70–99)
Potassium: 4.1 meq/L (ref 3.5–5.1)
Sodium: 137 meq/L (ref 135–145)

## 2013-02-14 LAB — CBC
MCH: 29.4 pg (ref 26.0–34.0)
MCV: 89.9 fL (ref 78.0–100.0)
Platelets: 142 10*3/uL — ABNORMAL LOW (ref 150–400)
RDW: 13.8 % (ref 11.5–15.5)

## 2013-02-14 LAB — MRSA PCR SCREENING: MRSA by PCR: POSITIVE — AB

## 2013-02-14 MED ORDER — CHLORHEXIDINE GLUCONATE CLOTH 2 % EX PADS
6.0000 | MEDICATED_PAD | Freq: Every day | CUTANEOUS | Status: DC
  Administered 2013-02-14 – 2013-02-16 (×3): 6 via TOPICAL

## 2013-02-14 MED ORDER — HALOPERIDOL LACTATE 5 MG/ML IJ SOLN: 2.0000 mg | Freq: Once | INTRAMUSCULAR | Status: DC

## 2013-02-14 MED ORDER — MUPIROCIN 2 % EX OINT
1.0000 "application " | TOPICAL_OINTMENT | Freq: Two times a day (BID) | CUTANEOUS | Status: DC
  Administered 2013-02-14 – 2013-02-16 (×6): 1 via NASAL
  Filled 2013-02-14 (×2): qty 22

## 2013-02-14 MED ORDER — MOMETASONE FURO-FORMOTEROL FUM 100-5 MCG/ACT IN AERO
2.0000 | INHALATION_SPRAY | Freq: Two times a day (BID) | RESPIRATORY_TRACT | Status: DC
  Administered 2013-02-14 – 2013-02-16 (×3): 2 via RESPIRATORY_TRACT
  Filled 2013-02-14: qty 8.8

## 2013-02-14 MED ORDER — BIOTENE DRY MOUTH MT LIQD
15.0000 mL | Freq: Two times a day (BID) | OROMUCOSAL | Status: DC
  Administered 2013-02-14 – 2013-02-16 (×6): 15 mL via OROMUCOSAL

## 2013-02-14 NOTE — Plan of Care (Signed)
Problem: Phase II Progression Outcomes Goal: Discharge plan established Outcome: Progressing Possibility of patient requiring additional skilled therapy after discharge. Goal: Obtain order to discontinue catheter if appropriate Outcome: Completed/Met Date Met:  02/14/13 Discontinued foley after patient seen by therapy to make use of bedside commode.

## 2013-02-14 NOTE — Progress Notes (Signed)
Patient becoming increasingly confused as the day wears on.  Insisting she wants to go home; she "has things to do". Staff has explained that she has had surgery and she is not currently strong enough to go home and be able to do her own daily activities. Her granddaughter is present in her room.  This nurse d/c'd her foley to allow her to wear her own panties and use the [facility] but she is insistent on getting in her own clothes and going home.  She doesn't want to wear her gown and throws it off.  Nurses put gown back on twice.  Patient began kicking and hitting at nurses and trying to push past the nurses to get her clothes to head  Home.  Granddaughter has spoken to family and some are on their way to visit patient.  Explained to patient multiple times that we would have to restrain her if she was unable to stay in the bed and call for assistance for bathroom help.  Patient persisted in her behaviors to try and get dressed to leave the room.  Explained the further risks of leaving the hospital prematurely and the risk for readmission for something more life threatening.  Patient positioned in bed and restrained by her wrists bilaterally.  Nurses explained that the restraints were to insure her safety.  Physician called after patient secured.  Received order for restraints and an order for Haldol IV to help patient reduce her anxiety.

## 2013-02-14 NOTE — Evaluation (Signed)
Physical Therapy Evaluation Patient Details Name: Pamela Guerrero MRN: [id number] DOB: 1941/03/27 Today's Date: 02/14/2013 Time: 4098-1191 PT Time Calculation (min): 29 min  PT Assessment / Plan / Recommendation Clinical Impression    Pt admitted with s/p lumbar fusion. Pt currently with functional limitations due to the deficits listed below (PT Problem List). Pt will benefit from skilled PT to increase their independence and safety with mobility to allow eventual return home. However feel pt will need ST-SNF prior to return home.     PT Assessment  Patient needs continued PT services    Follow Up Recommendations  SNF    Does the patient have the potential to tolerate intense rehabilitation      Barriers to Discharge        Equipment Recommendations  Rolling walker with 5" wheels    Recommendations for Other Services     Frequency Min 5X/week    Precautions / Restrictions Precautions Precautions: Back;Fall Required Braces or Orthoses: Spinal Brace Spinal Brace: Lumbar corset;Applied in sitting position   Pertinent Vitals/Pain See flow sheet      Mobility  Bed Mobility Bed Mobility: Rolling Right;Right Sidelying to Sit;Sit to Sidelying Right Rolling Right: 4: Min assist;With rail Right Sidelying to Sit: 3: Mod assist;HOB flat;With rails Sit to Sidelying Right: 4: Min assist Details for Bed Mobility Assistance: verbal cues for technique. Transfers Transfers: Sit to Stand;Stand to Sit Sit to Stand: 4: Min assist;With upper extremity assist;From bed;With armrests;From chair/3-in-1 Stand to Sit: 4: Min assist;With upper extremity assist;With armrests;To bed;To chair/3-in-1 Details for Transfer Assistance: verbal cues for technique and assist to bring hips up. Ambulation/Gait Ambulation/Gait Assistance: 3: Mod assist;4: Min assist Ambulation Distance (Feet): 15 Feet (x 2) Assistive device: Rolling walker Ambulation/Gait Assistance Details: verbal cues to stand more  erect and look where she is going. Gait Pattern: Step-through pattern;Decreased step length - right;Decreased step length - left;Shuffle;Left flexed knee in stance;Right flexed knee in stance;Decreased dorsiflexion - left;Decreased dorsiflexion - right;Trunk flexed Gait velocity: decr    Exercises     PT Diagnosis: Difficulty walking;Generalized weakness;Altered mental status  PT Problem List: Decreased strength;Decreased activity tolerance;Decreased balance;Decreased mobility;Decreased cognition;Decreased knowledge of precautions;Decreased safety awareness;Decreased knowledge of use of DME PT Treatment Interventions: DME instruction;Gait training;Patient/family education;Functional mobility training;Therapeutic activities;Therapeutic exercise;Balance training;Cognitive remediation   PT Goals Acute Rehab PT Goals PT Goal Formulation: Patient unable to participate in goal setting Time For Goal Achievement: 02/21/13 Potential to Achieve Goals: Good Pt will go Supine/Side to Sit: with supervision PT Goal: Supine/Side to Sit - Progress: Goal set today Pt will go Sit to Supine/Side: with supervision PT Goal: Sit to Supine/Side - Progress: Goal set today Pt will go Sit to Stand: with supervision PT Goal: Sit to Stand - Progress: Goal set today Pt will go Stand to Sit: with supervision PT Goal: Stand to Sit - Progress: Goal set today Pt will Ambulate: 51 - 150 feet;with supervision;with least restrictive assistive device PT Goal: Ambulate - Progress: Goal set today  Visit Information  Last PT Received On: 02/14/13 Assistance Needed: +1    Subjective Data  Subjective: "Is that your sister over there in the chair?" pt asked. Patient Stated Goal: Pt unable to state due to confusion.   Prior Functioning  Home Living Lives With: Spouse Available Help at Discharge: Family Home Adaptive Equipment: Straight cane Prior Function Comments: Pt reports she amb with cane.    Cognition   Cognition Arousal/Alertness: Awake/alert Behavior During Therapy: Restless Overall Cognitive Status: No family/caregiver  present to determine baseline cognitive functioning Area of Impairment: Orientation;Memory;Attention;Safety/judgement;Problem solving Orientation Level: Place;Time Current Attention Level: Sustained Memory: Decreased short-term memory Safety/Judgement: Decreased awareness of safety;Decreased awareness of deficits    Extremity/Trunk Assessment Right Lower Extremity Assessment RLE ROM/Strength/Tone: Deficits RLE ROM/Strength/Tone Deficits: functionally 4/5 Left Lower Extremity Assessment LLE ROM/Strength/Tone: Deficits LLE ROM/Strength/Tone Deficits: functionally 4/5   Balance Balance Balance Assessed: Yes Static Standing Balance Static Standing - Balance Support: Bilateral upper extremity supported Static Standing - Level of Assistance: 4: Min assist  End of Session PT - End of Session Equipment Utilized During Treatment: Gait belt;Back brace Activity Tolerance: Patient limited by fatigue;Other (comment) (confusion) Patient left: in bed;with call bell/phone within reach;with bed alarm set Nurse Communication: Mobility status  GP     [facility] 02/14/2013, 4:05 PM  [facility] PT [phone]

## 2013-02-14 NOTE — Progress Notes (Signed)
Patient ID: Pamela Guerrero, female   DOB: 18-Apr-1941, 72 y.o.   MRN: [id number] Subjective: Patient reports she is doing okay. She has appropriate postoperative back soreness. She denies leg pain or numbness tingling or weakness.  Objective: Vital signs in last 24 hours: Temp:  [97.7 F (36.5 C)-99.1 F (37.3 C)] 97.9 F (36.6 C) (05/24 0745) Pulse Rate:  [57-81] 81 (05/24 0700) Resp:  [13-19] 14 (05/24 0700) BP: (104-149)/(44-101) 105/44 mmHg (05/24 0700) SpO2:  [90 %-100 %] 94 % (05/24 0745) Weight:  [57.8 kg (127 lb 6.8 oz)] 57.8 kg (127 lb 6.8 oz) (05/23 2200)  Intake/Output from previous day: 05/23 0701 - 05/24 0700 In: 4830 [P.O.:480; I.V.:3500; Blood:300; IV Piggyback:550] Out: 2335 [Urine:1435; Blood:900] Intake/Output this shift:    Neurologic: Grossly normal  Lab Results: Lab Results  Component Value Date   WBC 10.0 02/14/2013   HGB 9.6* 02/14/2013   HCT 29.3* 02/14/2013   MCV 89.9 02/14/2013   PLT 142* 02/14/2013   No results found for this basename: INR, PROTIME   BMET Lab Results  Component Value Date   NA 137 02/14/2013   K 4.1 02/14/2013   CL 101 02/14/2013   CO2 26 02/14/2013   GLUCOSE 95 02/14/2013   BUN 9 02/14/2013   CREATININE 0.62 02/14/2013   CALCIUM 8.5 02/14/2013    Studies/Results: Dg Lumbar Spine 2-3 Views  02/13/2013   *RADIOLOGY REPORT*  Clinical Data: L3-L5 PLIF.  OPERATIVE LUMBAR SPINE - 2-3 VIEW  Comparison:  Lumbar spine x-rays 01/21/2013 [facility] demonstrating five non-rib bearing lumbar vertebrae and prior L5-S1 fusion with ray cages.  Findings: 2 spot images from the C-arm fluoroscopic device, AP and lateral views of the lumbar spine, were submitted for interpretation postoperatively.  Posterolateral fusion with hardware from L3-L5.  Interbody fusion plugs in the L3-4 and L4-5 disc spaces, appropriately positioned.  L4-5 decompression. Anatomic alignment on the lateral image.  IMPRESSION: Anatomic alignment post L3-L5 fusion.   Original  Report Authenticated By: Hulan Saas, M.D.   Dg Lumbar Spine 1 View  02/13/2013   *RADIOLOGY REPORT*  Clinical Data: L3-L5 posterior fusion.  LUMBAR SPINE - 1 VIEW  Comparison: AP and lateral lumbar spine radiographs obtained at [facility] on 01/21/2013.  Lumbar spine MR dated 09/03/2012  Findings: For counting purposes, the last open disc space is again labeled the L5-S1 level.  Ray cages are again demonstrated at that level.  There are also surgical spreaders posteriorly with a metallic localizer with its tip projected over the inferior L4 spinous process at the L4-5 level and a second localizer with its tip projected over the interspinous space at the L3-4 level.  IMPRESSION: Localizers at the L3-4 and L4-5 levels.   Original Report Authenticated By: Beckie Salts, M.D.   Dg C-arm 1-60 Min  02/13/2013   *RADIOLOGY REPORT*  Clinical Data: L3-L5 PLIF.  OPERATIVE LUMBAR SPINE - 2-3 VIEW  Comparison:  Lumbar spine x-rays 01/21/2013 [facility] demonstrating five non-rib bearing lumbar vertebrae and prior L5-S1 fusion with ray cages.  Findings: 2 spot images from the C-arm fluoroscopic device, AP and lateral views of the lumbar spine, were submitted for interpretation postoperatively.  Posterolateral fusion with hardware from L3-L5.  Interbody fusion plugs in the L3-4 and L4-5 disc spaces, appropriately positioned.  L4-5 decompression. Anatomic alignment on the lateral image.  IMPRESSION: Anatomic alignment post L3-L5 fusion.   Original Report Authenticated By: Hulan Saas, M.D.    Assessment/Plan: Patient's history doing okay. Her hemoglobin is  9.6 which is stable. We will transfer to the floor today. Mobilize with therapy.   LOS: 1 day    Ramil Edgington S 02/14/2013, 9:23 AM

## 2013-02-14 NOTE — Progress Notes (Signed)
Orthopedic Tech Progress Note Patient Details:  Pamela Guerrero 09-21-1941 [id number]  Patient ID: Pamela Guerrero, female   DOB: 01/11/1941, 72 y.o.   MRN: [id number] [employer] brace order completed as ordered by Dr. Rogene Houston, Marcille Barman 02/14/2013, 2:19 PM

## 2013-02-14 NOTE — Plan of Care (Signed)
Problem: Phase I Progression Outcomes Goal: OOB as tolerated unless otherwise ordered Outcome: Not Progressing Patient unable to be out of bed with therapy without a brace.

## 2013-02-15 ENCOUNTER — Inpatient Hospital Stay (HOSPITAL_COMMUNITY): Payer: Medicare Other | Attending: Neurological Surgery

## 2013-02-15 LAB — URINALYSIS, ROUTINE W REFLEX MICROSCOPIC
Glucose, UA: NEGATIVE mg/dL
Hgb urine dipstick: NEGATIVE
Ketones, ur: NEGATIVE mg/dL
Protein, ur: NEGATIVE mg/dL

## 2013-02-15 MED ORDER — NICOTINE 14 MG/24HR TD PT24
14.0000 mg | MEDICATED_PATCH | TRANSDERMAL | Status: DC
  Administered 2013-02-15: 14 mg via TRANSDERMAL
  Filled 2013-02-15 (×2): qty 1

## 2013-02-15 NOTE — Progress Notes (Signed)
Patient ID: Pamela Guerrero, female   DOB: 1941/08/09, 72 y.o.   MRN: [id number] Subjective:  The patient is alert and pleasant. She wants to go home. She looks well. She tells me "I always run a fever".  Objective: Vital signs in last 24 hours: Temp:  [98.6 F (37 C)-101.6 F (38.7 C)] 101.6 F (38.7 C) (05/25 0549) Pulse Rate:  [59-96] 96 (05/25 0549) Resp:  [18-24] 20 (05/25 0549) BP: (100-125)/(30-99) 125/53 mmHg (05/25 0549) SpO2:  [92 %-100 %] 92 % (05/25 0549)  Intake/Output from previous day: 05/24 0701 - 05/25 0700 In: 360 [P.O.:360] Out: 350 [Urine:350] Intake/Output this shift:    Physical exam patient is alert and oriented. She is moving her lower extremities well.  Lab Results:  Recent Labs  02/13/13 2023 02/14/13 0625  WBC  --  10.0  HGB 9.2* 9.6*  HCT 27.0* 29.3*  PLT  --  142*   BMET  Recent Labs  02/13/13 2023 02/14/13 0625  NA 143 137  K 3.9 4.1  CL  --  101  CO2  --  26  GLUCOSE 87 95  BUN  --  9  CREATININE  --  0.62  CALCIUM  --  8.5    Studies/Results: Dg Lumbar Spine 2-3 Views  02/13/2013   *RADIOLOGY REPORT*  Clinical Data: L3-L5 PLIF.  OPERATIVE LUMBAR SPINE - 2-3 VIEW  Comparison:  Lumbar spine x-rays 01/21/2013 [facility] demonstrating five non-rib bearing lumbar vertebrae and prior L5-S1 fusion with ray cages.  Findings: 2 spot images from the C-arm fluoroscopic device, AP and lateral views of the lumbar spine, were submitted for interpretation postoperatively.  Posterolateral fusion with hardware from L3-L5.  Interbody fusion plugs in the L3-4 and L4-5 disc spaces, appropriately positioned.  L4-5 decompression. Anatomic alignment on the lateral image.  IMPRESSION: Anatomic alignment post L3-L5 fusion.   Original Report Authenticated By: Hulan Saas, M.D.   Dg Lumbar Spine 1 View  02/13/2013   *RADIOLOGY REPORT*  Clinical Data: L3-L5 posterior fusion.  LUMBAR SPINE - 1 VIEW  Comparison: AP and lateral lumbar spine radiographs  obtained at [facility] on 01/21/2013.  Lumbar spine MR dated 09/03/2012  Findings: For counting purposes, the last open disc space is again labeled the L5-S1 level.  Ray cages are again demonstrated at that level.  There are also surgical spreaders posteriorly with a metallic localizer with its tip projected over the inferior L4 spinous process at the L4-5 level and a second localizer with its tip projected over the interspinous space at the L3-4 level.  IMPRESSION: Localizers at the L3-4 and L4-5 levels.   Original Report Authenticated By: Beckie Salts, M.D.   Dg C-arm 1-60 Min  02/13/2013   *RADIOLOGY REPORT*  Clinical Data: L3-L5 PLIF.  OPERATIVE LUMBAR SPINE - 2-3 VIEW  Comparison:  Lumbar spine x-rays 01/21/2013 [facility] demonstrating five non-rib bearing lumbar vertebrae and prior L5-S1 fusion with ray cages.  Findings: 2 spot images from the C-arm fluoroscopic device, AP and lateral views of the lumbar spine, were submitted for interpretation postoperatively.  Posterolateral fusion with hardware from L3-L5.  Interbody fusion plugs in the L3-4 and L4-5 disc spaces, appropriately positioned.  L4-5 decompression. Anatomic alignment on the lateral image.  IMPRESSION: Anatomic alignment post L3-L5 fusion.   Original Report Authenticated By: Hulan Saas, M.D.    Assessment/Plan: Postop day #2: The patient is doing well except below.  Fever: The chest x-ray results are pending. I will also check a urinalysis  blood cultures and CBC tomorrow. We will plan to observe the patient today and perhaps send her home tomorrow if the test turned out okay.    LOS: 2 days     [name] 02/15/2013, 9:57 AM

## 2013-02-15 NOTE — Progress Notes (Signed)
Occupational Therapy Evaluation Patient Details Name: Pamela Guerrero MRN: [id number] DOB: 18-Apr-1941 Today's Date: 02/15/2013 Time: 1257-1310 OT Time Calculation (min): 13 min  OT Assessment / Plan / Recommendation Clinical Impression  Patient presents to OT after spinal surgery with decreased ADL independence and safety due to back precautions, decreased mobility, decreased balance, and decreased cognition/safety awareness. Will benefit from skilled OT to facilitate safe d/c home, including educated husband on how to assist patient.    OT Assessment  Patient needs continued OT Services    Follow Up Recommendations  Supervision/Assistance - 24 hour;Home health OT    Barriers to Discharge      Equipment Recommendations  3 in 1 bedside comode    Recommendations for Other Services    Frequency  Min 2X/week    Precautions / Restrictions Precautions Precautions: Back;Fall Required Braces or Orthoses: Spinal Brace Spinal Brace: Lumbar corset;Applied in sitting position Restrictions Weight Bearing Restrictions: No   Pertinent Vitals/Pain     ADL  Upper Body Bathing: Simulated;Minimal assistance Where Assessed - Upper Body Bathing: Supine, head of bed up Lower Body Bathing: Simulated;Moderate assistance Where Assessed - Lower Body Bathing: Supine, head of bed up Upper Body Dressing: Simulated;Minimal assistance Where Assessed - Upper Body Dressing: Supine, head of bed up Lower Body Dressing: Simulated;Moderate assistance Where Assessed - Lower Body Dressing: Supine, head of bed up Transfers/Ambulation Related to ADLs: Pt refused EOB/OOB because she had just gotten back to bed after sitting up then going to the bathroom. ADL Comments: Reviewed back precautions with pt who could not recall any of them. Pt. highly confused during session.    OT Diagnosis: Generalized weakness;Acute pain;Altered mental status  OT Problem List: Decreased cognition;Decreased safety  awareness;Pain;Decreased strength;Impaired balance (sitting and/or standing);Decreased knowledge of use of DME or AE;Decreased knowledge of precautions OT Treatment Interventions: Self-care/ADL training;DME and/or AE instruction;Therapeutic activities;Patient/family education   OT Goals Acute Rehab OT Goals OT Goal Formulation: Patient unable to participate in goal setting Time For Goal Achievement: 03/01/13 Potential to Achieve Goals: Good ADL Goals Pt Will Perform Lower Body Bathing: with min assist;with caregiver independent in assisting;Sit to stand from bed;Sit to stand from chair ADL Goal: Lower Body Bathing - Progress: Goal set today Pt Will Perform Upper Body Dressing: with min assist;with caregiver independent in assisting;Sitting, bed;Sitting, chair ADL Goal: Upper Body Dressing - Progress: Goal set today Pt Will Perform Lower Body Dressing: with min assist;with caregiver independent in assisting;Sit to stand from chair;Sit to stand from bed ADL Goal: Lower Body Dressing - Progress: Goal set today Pt Will Transfer to Toilet: with supervision;with caregiver independent in assisting;with transfer board;3-in-1;Ambulation ADL Goal: Toilet Transfer - Progress: Goal set today Pt Will Perform Toileting - Clothing Manipulation: with supervision;with caregiver independent in assisting;Sitting on 3-in-1 or toilet;Standing ADL Goal: Toileting - Clothing Manipulation - Progress: Goal set today Pt Will Perform Toileting - Hygiene: with supervision;with caregiver independent in assisting;Sitting on 3-in-1 or toilet;Sit to stand from 3-in-1/toilet ADL Goal: Toileting - Hygiene - Progress: Goal set today Pt Will Perform Tub/Shower Transfer: Tub transfer;Shower transfer;with min assist;with caregiver independent in assisting ADL Goal: Web designer - Progress: Goal set today  Visit Information  Last OT Received On: 02/15/13 Assistance Needed: +1    Subjective Data  Subjective: "My nose  has something in it" Patient Stated Goal: none stated;pt unable to state   Prior Functioning     Home Living Lives With: Spouse Available Help at Discharge: Family Additional Comments: Patient reports she was I  with ADLs but she is a poor historian.  Prior Function Level of Independence: Independent with assistive device(s) (with cane per pt report) Communication Communication: No difficulties Dominant Hand: Right         Vision/Perception     Cognition  Cognition Arousal/Alertness: Awake/alert Behavior During Therapy: Restless Overall Cognitive Status: History of cognitive impairments - at baseline Area of Impairment: Orientation;Attention;Memory;Safety/judgement Orientation Level: Time Current Attention Level: Selective Memory: Decreased short-term memory Safety/Judgement: Decreased awareness of safety;Decreased awareness of deficits General Comments: son reports a history of difficulty with memory and pain medicine    Extremity/Trunk Assessment Right Upper Extremity Assessment RUE ROM/Strength/Tone: WFL for tasks assessed Left Upper Extremity Assessment LUE ROM/Strength/Tone: WFL for tasks assessed     Mobility Bed Mobility Bed Mobility: Rolling Right;Rolling Left Rolling Right: 4: Min assist;With rail Rolling Left: 4: Min assist;With rail Left Sidelying to Sit: 4: Min assist Details for Bed Mobility Assistance: sequencing cues throughout as well as verbal cues to maintain attention to task and precautions, min faciliatory assist for follow through of movement Transfers Sit to Stand: 4: Min assist;With upper extremity assist Stand to Sit: 4: Min guard;With upper extremity assist Details for Transfer Assistance: verbal and tactile cues for safe sequencing and hand placement, min stability assist as she stood     Exercise     Balance Static Standing Balance Static Standing - Balance Support: No upper extremity supported Static Standing - Level of Assistance:  5: Stand by assistance   End of Session OT - End of Session Activity Tolerance: Patient tolerated treatment well Patient left: in bed;with call bell/phone within reach;with family/visitor present  GO     Tatjana Turcott A 02/15/2013, 1:49 PM

## 2013-02-15 NOTE — Progress Notes (Signed)
Physical Therapy Treatment Patient Details Name: Pamela Guerrero MRN: [id number] DOB: 20-Apr-1941 Today's Date: 02/15/2013 Time: 1125-1200 PT Time Calculation (min): 35 min  PT Assessment / Plan / Recommendation Comments on Treatment Session  72 y/o female s/p lumbar fusion. Cognition still appears impaired however son made it sound like this is baseline for her. Mobility improved today still limited by pain. Education provided to son about back precautions, need for assist with all mobility and need for 24 hour assist at home because patient unable to retain information provided to her (poor attention and STM).  Precaution handout provided. Plan to educate husband tomorrow.     Follow Up Recommendations  Home health PT;Supervision/Assistance - 24 hour     Does the patient have the potential to tolerate intense rehabilitation     Barriers to Discharge        Equipment Recommendations  Rolling walker with 5" wheels    Recommendations for Other Services    Frequency Min 5X/week   Plan Discharge plan needs to be updated;Frequency remains appropriate    Precautions / Restrictions Precautions Precautions: Back;Fall Required Braces or Orthoses: Spinal Brace Spinal Brace: Lumbar corset;Applied in sitting position   Pertinent Vitals/Pain Reports back pain, did not give a rating, repositioned her back brace (it was on in supine) once seated EOB, educated patient and son on taking it off prior to lying down    Mobility  Bed Mobility Bed Mobility: Rolling Right;Rolling Left;Left Sidelying to Sit Rolling Right: 4: Min assist;With rail Rolling Left: 4: Min assist;With rail Left Sidelying to Sit: 4: Min assist Details for Bed Mobility Assistance: sequencing cues throughout as well as verbal cues to maintain attention to task and precautions, min faciliatory assist for follow through of movement Transfers Transfers: Sit to Stand;Stand to Sit Sit to Stand: 4: Min assist;With upper extremity  assist Stand to Sit: 4: Min guard;With upper extremity assist Details for Transfer Assistance: verbal and tactile cues for safe sequencing and hand placement, min stability assist as she stood Ambulation/Gait Ambulation/Gait Assistance: 4: Min assist Ambulation Distance (Feet): 150 Feet Assistive device: Rolling walker Ambulation/Gait Assistance Details: verbal cues for tall posture and safe positioning within RW especially with turns and backing up; as she fatigues her posture breaks down even more Gait Pattern: Lateral trunk lean to left;Decreased stance time - right;Trunk flexed;Decreased stride length Gait velocity: decr Stairs: No      PT Goals Acute Rehab PT Goals PT Goal: Supine/Side to Sit - Progress: Progressing toward goal PT Goal: Sit to Stand - Progress: Progressing toward goal PT Goal: Stand to Sit - Progress: Progressing toward goal PT Goal: Ambulate - Progress: Progressing toward goal  Visit Information  Last PT Received On: 02/15/13 Assistance Needed: +1    Subjective Data  Subjective: There is this bone digging into my back! Patient Stated Goal: home   Cognition  Cognition Arousal/Alertness: Awake/alert Behavior During Therapy: Restless (impulsive) Overall Cognitive Status: History of cognitive impairments - at baseline Area of Impairment: Orientation;Attention;Memory;Safety/judgement Orientation Level: Time Current Attention Level: Selective Memory: Decreased short-term memory Safety/Judgement: Decreased awareness of safety;Decreased awareness of deficits General Comments: son reports a history of difficulty with memory and pain medicine    Balance  Static Standing Balance Static Standing - Balance Support: No upper extremity supported Static Standing - Level of Assistance: 5: Stand by assistance  End of Session PT - End of Session Equipment Utilized During Treatment: Gait belt;Back brace Activity Tolerance: Patient tolerated treatment well Patient  left: in  chair;with call bell/phone within reach;with family/visitor present Nurse Communication: Mobility status (change in d/c plan)   GP     [facility] HELEN 02/15/2013, 12:36 PM

## 2013-02-16 LAB — CBC
MCH: 29.9 pg (ref 26.0–34.0)
MCHC: 33.9 g/dL (ref 30.0–36.0)
Platelets: 135 10*3/uL — ABNORMAL LOW (ref 150–400)
RBC: 2.74 MIL/uL — ABNORMAL LOW (ref 3.87–5.11)
RDW: 13.6 % (ref 11.5–15.5)

## 2013-02-16 LAB — URINALYSIS, ROUTINE W REFLEX MICROSCOPIC
Bilirubin Urine: NEGATIVE
Glucose, UA: NEGATIVE mg/dL
Ketones, ur: NEGATIVE mg/dL
Leukocytes, UA: NEGATIVE
pH: 6.5 (ref 5.0–8.0)

## 2013-02-16 MED ORDER — METHOCARBAMOL 500 MG PO TABS: 500.0000 mg | ORAL_TABLET | Freq: Four times a day (QID) | ORAL | Status: DC | PRN

## 2013-02-16 MED ORDER — DSS 100 MG PO CAPS: 100.0000 mg | ORAL_CAPSULE | Freq: Two times a day (BID) | ORAL | Status: DC

## 2013-02-16 MED ORDER — OXYCODONE-ACETAMINOPHEN 5-325 MG PO TABS: 1.0000 | ORAL_TABLET | ORAL | Status: DC | PRN

## 2013-02-16 NOTE — Discharge Summary (Signed)
Physician Discharge Summary  Patient ID: Pamela Guerrero MRN: [id number] DOB/AGE: [birth year] 72 y.o.  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 Danielle Dess admitted the patient to [facility] on 02/13/2013. On that day performed at L3-4 and L4-5 decompression, instrumentation, and fusion.  The patient's postoperative course was remarkable only for a fever. This was worked up with a chest x-ray, urinalysis, blood cultures etc. all which turned out okay. White count was mildly elevated at 13. The fever resolved and the patient requested discharge to home. The patient was given oral and written discharge instructions. All her questions were answered.  Consults: PT OT Significant Diagnostic Studies: None Treatments: L3-4 and L4-5 decompression, instrumentation, and fusion. Discharge Exam: Blood pressure 113/87, pulse 90, temperature 98.4 F (36.9 C), temperature source Oral, resp. rate 20, height 5\' 4"  (1.626 m), weight 57.8 kg (127 lb 6.8 oz), SpO2 98.00%. The patient is alert and pleasant. She looks well. She is moving her lower extremities well.  Disposition: Home  Discharge Orders   Future Orders Complete By Expires     Call MD for:  difficulty breathing, headache or visual disturbances  As directed     Call MD for:  extreme fatigue  As directed     Call MD for:  hives  As directed     Call MD for:  persistant dizziness or light-headedness  As directed     Call MD for:  persistant nausea and vomiting  As directed     Call MD for:  redness, tenderness, or signs of infection (pain, swelling, redness, odor or green/yellow discharge around incision site)  As directed     Call MD for:  severe uncontrolled pain  As directed     Call MD for:  temperature >100.4  As directed     Diet - low sodium heart healthy  As directed     Discharge instructions  As directed     Comments:      Call [phone] for a followup appointment. Take a stool softener while you are using pain medications.    Driving Restrictions  As directed     Comments:      Do not drive for 2 weeks.    Increase activity slowly  As directed     Lifting restrictions  As directed     Comments:      Do not lift more than 5 pounds. No excessive bending or twisting.    May shower / Bathe  As directed     Comments:      He may shower after the pain she is removed 3 days after surgery. Leave the incision alone.    No dressing needed  As directed         Medication List    STOP taking these medications       HYDROcodone-acetaminophen 10-325 MG per tablet  Commonly known as:  NORCO      TAKE these medications       ALPRAZolam 1 MG 24 hr tablet  Commonly known as:  XANAX XR  Take 1 mg by mouth at bedtime.     clonazePAM 2 MG tablet  Commonly known as:  KLONOPIN  Take 2 mg by mouth at bedtime.     DSS 100 MG  Caps  Take 100 mg by mouth 2 (two) times daily.     FLUoxetine 40 MG capsule  Commonly known as:  PROZAC  Take 40 mg by mouth daily.     Fluticasone-Salmeterol 250-50 MCG/DOSE Aepb  Commonly known as:  ADVAIR  Inhale 1 puff into the lungs 2 (two) times daily.     levothyroxine 100 MCG tablet  Commonly known as:  SYNTHROID, LEVOTHROID  Take 50 mcg by mouth daily before breakfast.     methocarbamol 500 MG tablet  Commonly known as:  ROBAXIN  Take 1 tablet (500 mg total) by mouth every 6 (six) hours as needed.     mometasone 50 MCG/ACT nasal spray  Commonly known as:  NASONEX  Place 2 sprays into the nose 2 (two) times daily.     omeprazole 20 MG capsule  Commonly known as:  PRILOSEC  Take 20 mg by mouth daily.     oxyCODONE-acetaminophen 5-325 MG per tablet  Commonly known as:  PERCOCET/ROXICET  Take 1-2 tablets by mouth  every 4 (four) hours as needed.     simvastatin 40 MG tablet  Commonly known as:  ZOCOR  Take 40 mg by mouth every evening.         SignedTressie Stalker D 02/16/2013, 10:02 AM

## 2013-02-16 NOTE — Progress Notes (Signed)
Occupational Therapy Treatment Patient Details Name: Pamela Guerrero MRN: [id number] DOB: 08/15/1941 Today's Date: 02/16/2013 Time: 4098-1191 OT Time Calculation (min): 40 min  OT Assessment / Plan / Recommendation Comments on Treatment Session Following up with Pt after surgical decompression of L3-4 and L4-5. Main focus of treatment was caregiver education and maintaining safety precautions. Pt and caregiver (husband) able to dress full body as a team with mod cues for safety precautions (when to wear brace) and ambulate to bathroom, toilet, and return to chair.    Follow Up Recommendations  Supervision/Assistance - 24 hour;Home health OT       Equipment Recommendations  3 in 1 bedside comode       Frequency Min 2X/week   Plan Discharge plan remains appropriate    Precautions / Restrictions Precautions Precautions: Back;Fall Precaution Booklet Issued: Yes (comment) Precaution Comments: During session both pt and caregiver able to recall 3/3 back precautions Required Braces or Orthoses: Spinal Brace Spinal Brace: Lumbar corset;Applied in sitting position Restrictions Weight Bearing Restrictions: No   Pertinent Vitals/Pain Pt did not report pain during session    ADL  Grooming: Performed;Wash/dry hands Where Assessed - Grooming: Unsupported standing Upper Body Dressing: Performed;Moderate assistance;Other (comment) (assisted by husband) Where Assessed - Upper Body Dressing: Unsupported sitting Lower Body Dressing: Performed;Moderate assistance;Other (comment) (assisted by husband) Where Assessed - Lower Body Dressing: Lean right and/or left;Unsupported sitting;Unsupported sit to stand Toilet Transfer: Performed;Set up Toilet Transfer Method: Sit to stand Toilet Transfer Equipment: Raised toilet seat with arms (or 3-in-1 over toilet) Toileting - Clothing Manipulation and Hygiene: Performed;Moderate assistance;Other (comment) (assisted by husband) Where Assessed - Toileting  Clothing Manipulation and Hygiene: Sit to stand from 3-in-1 or toilet Equipment Used: Back brace;Gait belt;Rolling walker;Other (comment) (3in1) Transfers/Ambulation Related to ADLs: Pt needs min cues regarding precautions.  ADL Comments: Pt able to perform sink level handwashing, and toileting at supervision level. Pt required assistance with manipulating toileting clothing, which her husband performed in preparation for returning home.     OT Goals Acute Rehab OT Goals Time For Goal Achievement: 03/01/13 Potential to Achieve Goals: Good ADL Goals Pt Will Perform Lower Body Bathing: with min assist;with caregiver independent in assisting;Sit to stand from bed;Sit to stand from chair Pt Will Perform Upper Body Dressing: with min assist;with caregiver independent in assisting;Sitting, bed;Sitting, chair ADL Goal: Upper Body Dressing - Progress: Progressing toward goals Pt Will Perform Lower Body Dressing: with min assist;with caregiver independent in assisting;Sit to stand from chair;Sit to stand from bed ADL Goal: Lower Body Dressing - Progress: Progressing toward goals Pt Will Transfer to Toilet: with supervision;with caregiver independent in assisting;with transfer board;3-in-1;Ambulation ADL Goal: Toilet Transfer - Progress: Met Pt Will Perform Toileting - Clothing Manipulation: with supervision;with caregiver independent in assisting;Sitting on 3-in-1 or toilet;Standing ADL Goal: Toileting - Clothing Manipulation - Progress: Met Pt Will Perform Toileting - Hygiene: with supervision;with caregiver independent in assisting;Sitting on 3-in-1 or toilet;Sit to stand from 3-in-1/toilet ADL Goal: Toileting - Hygiene - Progress: Met Pt Will Perform Tub/Shower Transfer: Tub transfer;Shower transfer;with min assist;with caregiver independent in assisting  Visit Information  Last OT Received On: 02/16/13 Assistance Needed: +1          Cognition  Cognition Arousal/Alertness:  Awake/alert Behavior During Therapy: Restless Overall Cognitive Status: History of cognitive impairments - at baseline Area of Impairment: Following commands;Safety/judgement;Awareness Current Attention Level: Sustained Memory: Decreased short-term memory Following Commands: Follows one step commands with increased time Safety/Judgement: Decreased awareness of safety General Comments: Education highly  emphasized during session for Pt and caregiver. Handout provided, and Pt got dressed with help of caregiver so they could ask questions and practice don/doff brace.  Mod cues for back precautions    Mobility  Bed Mobility Bed Mobility: Not assessed Rolling Right: 5: Supervision (v/c for precautions and technique) Right Sidelying to Sit: 4: Min assist;HOB flat (provided by husband) Sitting - Scoot to Edge of Bed: 5: Supervision Details for Bed Mobility Assistance: v/c for log roll technique  Transfers Transfers: Sit to Stand;Stand to Sit Sit to Stand: 4: Min guard;With upper extremity assist;From chair/3-in-1;From toilet Stand to Sit: 4: Min guard;With upper extremity assist;To chair/3-in-1;To toilet Details for Transfer Assistance: vc's for safe hand placement and controlled descent.       Balance Static Standing Balance Static Standing - Balance Support: No upper extremity supported Static Standing - Level of Assistance: 5: Stand by assistance (pt cleaning hands)   End of Session OT - End of Session Equipment Utilized During Treatment: Gait belt;Back brace;Other (comment) (RW, 3in1) Activity Tolerance: Patient tolerated treatment well Patient left: in chair;with call bell/phone within reach;with family/visitor present Nurse Communication: Mobility status;Other (comment) (Pt wanted IV out)  GO     Sherryl Manges 02/16/2013, 11:18 AM

## 2013-02-16 NOTE — Progress Notes (Signed)
I agree with the following treatment note after reviewing documentation.   Johnston, Scotty Pinder Brynn   OTR/L Pager: [phone] Office: [phone] .   

## 2013-02-16 NOTE — Progress Notes (Signed)
I have read and agree with the below treatment note and plan. Binyomin Brann Helen Whitlow PT, DPT Pager: [phone] 

## 2013-02-16 NOTE — Care Management Note (Signed)
CARE MANAGEMENT NOTE 02/16/2013  Patient:  Pamela Guerrero,Pamela Guerrero   Account Number:  [id number]  Date Initiated:  02/16/2013  Documentation initiated by:  Georgeann Brinkman  Subjective/Objective Assessment:   71 yr old female s/p L3-4,L4-5 decompression, fusion     Action/Plan:   CM spoke with patient concerning home health and DME needs at discharge.  Choice offered. Called referral to [facility]- [facility] liason.   Anticipated DC Date:  02/16/2013   Anticipated DC Plan:  HOME W HOME HEALTH SERVICES      DC Planning Services  CM consult      PAC Choice  DURABLE MEDICAL EQUIPMENT  HOME HEALTH   Choice offered to / List presented to:  C-1 Patient   DME arranged  3-N-1  WALKER - ROLLING      DME agency  [facility]     HH arranged  HH-2 PT  HH-3 OT      HH agency  [facility]   Status of service:  Completed, signed off Medicare Important Message given?   (If response is "NO", the following Medicare IM given date fields will be blank) Date Medicare IM given:   Date Additional Medicare IM given:    Discharge Disposition:  HOME W HOME HEALTH SERVICES  Per UR Regulation:    If discussed at Long Length of Stay Meetings, dates discussed:    Comments:    

## 2013-02-16 NOTE — Progress Notes (Signed)
UR COMPLETED  

## 2013-02-16 NOTE — Progress Notes (Signed)
Physical Therapy Treatment Patient Details Name: Pamela Guerrero MRN: [id number] DOB: 1941-06-15 Today's Date: 02/16/2013 Time: 8295-6213 PT Time Calculation (min): 33 min  PT Assessment / Plan / Recommendation Comments on Treatment Session  72 y/o female s/p lumbar fusion. Pt able to complete transfers and ambulation without physical assist. Pt with limited recall of precautions. Educated pt and husband on precautions, home care, need for supervision, and HHPT.  Acute PT to maximize functional mobility and saftey for d/c home.    Follow Up Recommendations  Home health PT;Supervision/Assistance - 24 hour     Equipment Recommendations  Rolling walker with 5" wheels       Frequency Min 5X/week   Plan Discharge plan remains appropriate    Precautions / Restrictions Precautions Precautions: Back;Fall Precaution Booklet Issued: No Precaution Comments: at end of session, pt caregivver able to verbally recall 3/3 precautions. Required Braces or Orthoses: Spinal Brace Spinal Brace: Lumbar corset;Applied in sitting position Restrictions Weight Bearing Restrictions: No   Pertinent Vitals/Pain 4/10 back pain during ambulation.    Mobility  Bed Mobility Bed Mobility: Rolling Right;Right Sidelying to Sit;Sitting - Scoot to [place] of Bed Rolling Right: 5: Supervision (v/c for precautions and technique) Right Sidelying to Sit: 4: Min assist;HOB flat (provided by husband) Sitting - Scoot to Edge of Bed: 5: Supervision Details for Bed Mobility Assistance: v/c for log roll technique  Transfers Transfers: Sit to Stand;Stand to Sit Sit to Stand: 4: Min guard;With upper extremity assist Stand to Sit: 4: Min guard;With upper extremity assist Details for Transfer Assistance: verbal and tactile cues for safe hand placement and controlled descent. Ambulation/Gait Ambulation/Gait Assistance: 5: Supervision Ambulation Distance (Feet): 225 Feet Assistive device: Rolling walker Ambulation/Gait  Assistance Details: v/c for upright posture. Educated pt husband on proper supervision/assist Gait Pattern: Lateral trunk lean to left;Decreased stance time - right;Trunk flexed;Decreased stride length Gait velocity: decreased Stairs: No     PT Goals Acute Rehab PT Goals PT Goal: Supine/Side to Sit - Progress: Progressing toward goal PT Goal: Sit to Supine/Side - Progress: Progressing toward goal PT Goal: Sit to Stand - Progress: Progressing toward goal PT Goal: Stand to Sit - Progress: Progressing toward goal PT Goal: Ambulate - Progress: Met  Visit Information  Last PT Received On: 02/16/13 Assistance Needed: +1    Subjective Data  Subjective: "I am just ready to get out of here" Patient Stated Goal: home   Cognition  Cognition Arousal/Alertness: Awake/alert Overall Cognitive Status: History of cognitive impairments - at baseline Area of Impairment: Memory;Attention;Safety/judgement Current Attention Level: Selective Memory: Decreased short-term memory Safety/Judgement: Decreased awareness of safety General Comments: all education provided to pt and caregiver     Balance  Static Standing Balance Static Standing - Balance Support: No upper extremity supported Static Standing - Level of Assistance: 5: Stand by assistance (pt cleaning hands)  End of Session PT - End of Session Equipment Utilized During Treatment: Gait belt;Back brace Activity Tolerance: Patient tolerated treatment well Patient left: in chair;with call bell/phone within reach;with family/visitor present Nurse Communication: Mobility status   GP     Payton Doughty 02/16/2013, 10:34 AM Payton Doughty, PT Student

## 2013-02-17 LAB — URINE CULTURE
Colony Count: NO GROWTH
Special Requests: NORMAL

## 2013-02-17 MED FILL — Sodium Chloride IV Soln 0.9%: INTRAVENOUS | Qty: 1000 | Status: AC

## 2013-02-17 MED FILL — Heparin Sodium (Porcine) Inj 1000 Unit/ML: INTRAMUSCULAR | Qty: 30 | Status: AC

## 2013-02-21 ENCOUNTER — Emergency Department (HOSPITAL_COMMUNITY)
Admission: EM | Admit: 2013-02-21 | Discharge: 2013-02-21 | Disposition: A | Discharge status: Home / Self Care | Payer: Medicare Other | Attending: Emergency Medicine | Admitting: Emergency Medicine

## 2013-02-21 ENCOUNTER — Emergency Department (HOSPITAL_COMMUNITY): Payer: Medicare Other

## 2013-02-21 ENCOUNTER — Encounter (HOSPITAL_COMMUNITY): Payer: Self-pay | Admitting: Emergency Medicine

## 2013-02-21 DIAGNOSIS — F17200 Nicotine dependence, unspecified, uncomplicated: Secondary | ICD-10-CM | POA: Insufficient documentation

## 2013-02-21 DIAGNOSIS — S335XXA Sprain of ligaments of lumbar spine, initial encounter: Secondary | ICD-10-CM | POA: Insufficient documentation

## 2013-02-21 DIAGNOSIS — F3289 Other specified depressive episodes: Secondary | ICD-10-CM | POA: Insufficient documentation

## 2013-02-21 DIAGNOSIS — Z79899 Other long term (current) drug therapy: Secondary | ICD-10-CM | POA: Insufficient documentation

## 2013-02-21 DIAGNOSIS — J449 Chronic obstructive pulmonary disease, unspecified: Secondary | ICD-10-CM | POA: Insufficient documentation

## 2013-02-21 DIAGNOSIS — E039 Hypothyroidism, unspecified: Secondary | ICD-10-CM | POA: Insufficient documentation

## 2013-02-21 DIAGNOSIS — Y998 Other external cause status: Secondary | ICD-10-CM | POA: Insufficient documentation

## 2013-02-21 DIAGNOSIS — S39012A Strain of muscle, fascia and tendon of lower back, initial encounter: Secondary | ICD-10-CM

## 2013-02-21 DIAGNOSIS — R296 Repeated falls: Secondary | ICD-10-CM | POA: Insufficient documentation

## 2013-02-21 DIAGNOSIS — Y92009 Unspecified place in unspecified non-institutional (private) residence as the place of occurrence of the external cause: Secondary | ICD-10-CM | POA: Insufficient documentation

## 2013-02-21 DIAGNOSIS — W19XXXA Unspecified fall, initial encounter: Secondary | ICD-10-CM

## 2013-02-21 DIAGNOSIS — Z885 Allergy status to narcotic agent status: Secondary | ICD-10-CM | POA: Insufficient documentation

## 2013-02-21 DIAGNOSIS — Z9981 Dependence on supplemental oxygen: Secondary | ICD-10-CM | POA: Insufficient documentation

## 2013-02-21 DIAGNOSIS — F329 Major depressive disorder, single episode, unspecified: Secondary | ICD-10-CM | POA: Insufficient documentation

## 2013-02-21 DIAGNOSIS — R4182 Altered mental status, unspecified: Secondary | ICD-10-CM | POA: Insufficient documentation

## 2013-02-21 DIAGNOSIS — Z888 Allergy status to other drugs, medicaments and biological substances status: Secondary | ICD-10-CM | POA: Insufficient documentation

## 2013-02-21 DIAGNOSIS — Z9889 Other specified postprocedural states: Secondary | ICD-10-CM | POA: Insufficient documentation

## 2013-02-21 DIAGNOSIS — J4489 Other specified chronic obstructive pulmonary disease: Secondary | ICD-10-CM | POA: Insufficient documentation

## 2013-02-21 DIAGNOSIS — M129 Arthropathy, unspecified: Secondary | ICD-10-CM | POA: Insufficient documentation

## 2013-02-21 DIAGNOSIS — Z8701 Personal history of pneumonia (recurrent): Secondary | ICD-10-CM | POA: Insufficient documentation

## 2013-02-21 LAB — CBC
HCT: 28.1 % — ABNORMAL LOW (ref 36.0–46.0)
Hemoglobin: 9.3 g/dL — ABNORMAL LOW (ref 12.0–15.0)
MCH: 29.6 pg (ref 26.0–34.0)
MCHC: 33.1 g/dL (ref 30.0–36.0)
MCV: 89.5 fL (ref 78.0–100.0)
Platelets: 451 10*3/uL — ABNORMAL HIGH (ref 150–400)
RBC: 3.14 MIL/uL — ABNORMAL LOW (ref 3.87–5.11)
RDW: 14 % (ref 11.5–15.5)
WBC: 12.5 10*3/uL — ABNORMAL HIGH (ref 4.0–10.5)

## 2013-02-21 LAB — COMPREHENSIVE METABOLIC PANEL WITH GFR
ALT: 13 U/L (ref 0–35)
AST: 18 U/L (ref 0–37)
Albumin: 3.3 g/dL — ABNORMAL LOW (ref 3.5–5.2)
BUN: 12 mg/dL (ref 6–23)
Calcium: 9 mg/dL (ref 8.4–10.5)
Chloride: 100 meq/L (ref 96–112)
Glucose, Bld: 94 mg/dL (ref 70–99)
Potassium: 3.9 meq/L (ref 3.5–5.1)
Total Protein: 7 g/dL (ref 6.0–8.3)

## 2013-02-21 LAB — COMPREHENSIVE METABOLIC PANEL
Alkaline Phosphatase: 82 U/L (ref 39–117)
CO2: 28 mEq/L (ref 19–32)
Creatinine, Ser: 0.68 mg/dL (ref 0.50–1.10)
GFR calc Af Amer: >90 mL/min (ref >90)
GFR calc non Af Amer: 87 mL/min — ABNORMAL LOW (ref >90)
Sodium: 138 mEq/L (ref 135–145)
Total Bilirubin: 0.4 mg/dL (ref 0.3–1.2)

## 2013-02-21 LAB — URINALYSIS, ROUTINE W REFLEX MICROSCOPIC
Bilirubin Urine: NEGATIVE
Glucose, UA: NEGATIVE mg/dL
Hgb urine dipstick: NEGATIVE
Ketones, ur: NEGATIVE mg/dL
Leukocytes, UA: NEGATIVE
Nitrite: NEGATIVE
Protein, ur: NEGATIVE mg/dL
Specific Gravity, Urine: 1.014 (ref 1.005–1.030)
Urobilinogen, UA: 1 mg/dL (ref 0.0–1.0)
pH: 7.5 (ref 5.0–8.0)

## 2013-02-21 LAB — CULTURE, BLOOD (ROUTINE X 2): Culture: NO GROWTH

## 2013-02-21 MED ORDER — HYDROCODONE-ACETAMINOPHEN 5-325 MG PO TABS: ORAL_TABLET | ORAL | Status: DC

## 2013-02-21 MED ORDER — HYDROCODONE-ACETAMINOPHEN 5-325 MG PO TABS
1.0000 | ORAL_TABLET | Freq: Once | ORAL | Status: AC
  Administered 2013-02-21: 1 via ORAL
  Filled 2013-02-21: qty 1

## 2013-02-21 NOTE — ED Provider Notes (Signed)
History     CSN: [id number]  Arrival date & time 02/21/13  1604   First MD Initiated Contact with Patient 02/21/13 1701      Chief Complaint  Patient presents with  . Back Pain  . Altered Mental Status    (Consider location/radiation/quality/duration/timing/severity/associated sxs/prior treatment) HPI Comments: Pt had lumbar fusion surgery about 1 week ago, has had prior back surgeries as well, this was due to disc problems per son.  Pt fell yesterday while in kitchen, landed on tailbone.  Since fall, pt's right posterior back and pain down buttock into leg has been worse.  No distal numbness or foot weakness.  PT also has been slightly more confused that at baseline per family.  No fevers, chills.  Pt is asking for pain meds for her back.  She has been taking hydrocodone at home with some improvement in pain.  No difficulty urinating.  She has been able to walk and bear weight.  Had partial PT yesterday as best as pain would dictate.  No deformity to right hip, no drainage or color change to surgical site.    Patient is a 72 y.o. female presenting with back pain and altered mental status. The history is provided by the patient and a relative.  Back Pain Associated symptoms: no abdominal pain, no fever, no numbness and no weakness   Altered Mental Status Presenting symptoms: confusion   Associated symptoms: no abdominal pain, no fever, no nausea, no rash, no vomiting and no weakness     Past Medical History  Diagnosis Date  . COPD (chronic obstructive pulmonary disease)   . Pneumonia     2 times in 2013  . Arthritis   . Hypothyroidism   . On home oxygen therapy     at bedtime  . Depression     Past Surgical History  Procedure Laterality Date  . Appendectomy    . Back surgery    . Abdominal hysterectomy    . Nasal sinus surgery      x2  . Eye surgery Bilateral     Cataract    History reviewed. No pertinent family history.  History  Substance Use Topics  . Smoking  status: Current Every Day Smoker -- 0.50 packs/day for 40 years  . Smokeless tobacco: Not on file  . Alcohol Use: No    OB History   Grav Para Term Preterm Abortions TAB SAB Ect Mult Living                  Review of Systems  Constitutional: Negative for fever and chills.  Respiratory: Negative for cough and shortness of breath.   Gastrointestinal: Negative for nausea, vomiting, abdominal pain and diarrhea.  Genitourinary: Negative for frequency and flank pain.  Musculoskeletal: Positive for back pain. Negative for gait problem.  Skin: Negative for color change, pallor, rash and wound.  Neurological: Negative for weakness and numbness.  Psychiatric/Behavioral: Positive for confusion and altered mental status.  All other systems reviewed and are negative.    Allergies  Morphine and related; Prednisone; and Codeine  Home Medications   Current Outpatient Rx  Name  Route  Sig  Dispense  Refill  . ALPRAZolam (XANAX XR) 1 MG 24 hr tablet   Oral   Take 1 mg by mouth at bedtime.         . clonazePAM (KLONOPIN) 2 MG tablet   Oral   Take 2 mg by mouth at bedtime.         Marland Kitchen  FLUoxetine (PROZAC) 40 MG capsule   Oral   Take 40 mg by mouth daily.         . Fluticasone-Salmeterol (ADVAIR) 250-50 MCG/DOSE AEPB   Inhalation   Inhale 1 puff into the lungs 2 (two) times daily.         Marland Kitchen HYDROcodone-acetaminophen (NORCO) 10-325 MG per tablet   Oral   Take 1 tablet by mouth every 6 (six) hours as needed for pain.         Marland Kitchen levothyroxine (SYNTHROID, LEVOTHROID) 100 MCG tablet   Oral   Take 50 mcg by mouth daily before breakfast.         . mometasone (NASONEX) 50 MCG/ACT nasal spray   Nasal   Place 2 sprays into the nose 2 (two) times daily.         Marland Kitchen omeprazole (PRILOSEC) 20 MG capsule   Oral   Take 20 mg by mouth daily.         . promethazine (PHENERGAN) 12.5 MG tablet   Oral   Take 12.5 mg by mouth every 6 (six) hours as needed for nausea.         .  simvastatin (ZOCOR) 40 MG tablet   Oral   Take 40 mg by mouth every evening.         Marland Kitchen HYDROcodone-acetaminophen (NORCO/VICODIN) 5-325 MG per tablet      1-2 tablets po q 6 hours prn moderate to severe pain   20 tablet   0     BP 138/120  Pulse 75  Temp(Src) 99 F (37.2 C) (Oral)  Resp 18  SpO2 97%  Physical Exam  Nursing note and vitals reviewed. Constitutional: She appears well-developed and well-nourished.  HENT:  Head: Normocephalic and atraumatic.  Eyes: EOM are normal.  Neck: Normal range of motion. Neck supple.  Cardiovascular: Normal rate, regular rhythm and intact distal pulses.  Exam reveals no gallop.   No murmur heard. Pulses:      Dorsalis pedis pulses are 2+ on the right side, and 2+ on the left side.  Pulmonary/Chest: No respiratory distress.  Abdominal: Soft. She exhibits no distension. There is no tenderness. There is no guarding.  Musculoskeletal:       Right hip: She exhibits normal range of motion, normal strength, no tenderness, no bony tenderness, no deformity and no laceration.       Back:  Neurological: She is alert. She displays no atrophy. She exhibits normal muscle tone.  5/5 plantar and dorsiflexion strength of B ankles  Skin: Skin is warm and dry. She is not diaphoretic.  Psychiatric: She has a normal mood and affect.    ED Course  Procedures (including critical care time)  Labs Reviewed  COMPREHENSIVE METABOLIC PANEL - Abnormal; Notable for the following:    Albumin 3.3 (*)    GFR calc non Af Amer 87 (*)    All other components within normal limits  CBC - Abnormal; Notable for the following:    WBC 12.5 (*)    RBC 3.14 (*)    Hemoglobin 9.3 (*)    HCT 28.1 (*)    Platelets 451 (*)    All other components within normal limits  URINALYSIS, ROUTINE W REFLEX MICROSCOPIC - Abnormal; Notable for the following:    APPearance CLOUDY (*)    All other components within normal limits  GLUCOSE, CAPILLARY   Dg Lumbar Spine  Complete  02/21/2013   *RADIOLOGY REPORT*  Clinical Data: Back pain, postop lumbar  fusion last week, fall yesterday  LUMBAR SPINE - COMPLETE 4+ VIEW  Comparison: Intraoperative fluoroscopic spot images dated 02/13/2013  Findings: Five lumbar-type vertebral bodies.  No evidence of fracture or dislocation.  The vertebral body heights are maintained.  Stable alignment.  Status post PLIF at L3-5.  Prior ray cage fusion at L5-S1. No evidence of hardware loosening or fracture.  Mild multilevel degenerative changes.  Visualized bony pelvis appears intact.  Cholecystectomy clips.  IMPRESSION: No fracture or dislocation is seen.  Stable alignment status post L3-5 PLIF with prior ray cage fusion at L5-S1.  No evidence of hardware complication.   Original Report Authenticated By: Charline Bills, M.D.     1. Fall at home, initial encounter   2. Lumbar strain, initial encounter     ra sat is 97% and I interpret to be normal.  7:35 PM Some improvement after vicodin here.  Pt will need a refill.  Plain films are negative for acute injury, fracture, or problems with surgery.  Son voices some concerns about her ability to function with her spouse as both have some dementia.  We discussed the issue with requiring placement for non acute medical issues at this time and hour.  He understands and will speak to her PCP office first thing Monday.  We have no social work or case management available at this hour either.  UA is unremarkable.   MDM  Pt s/p fall 1 week after lumbar fusion surgery.  Good strength, normal patellar reflexes.  Will treat pain, obtain plain films to assess for new fracture.  Pt is alert, oriented, conversant.  No focal deficits such as arm drift, past pointing on exam here.  Pt's family didn't think she was on meds for muscle relaxation or anxiety, however her MAR shows she is on both xanax 1 mg and klonopin 2 mg at bedtime.            Gavin Pound. Oletta Lamas, MD 02/21/13 4098

## 2013-02-21 NOTE — ED Notes (Signed)
Pt c/o lower back pain with radiation down right leg and increased confusion; pt had fall x 1; pt had back sx on Friday

## 2013-02-21 NOTE — ED Notes (Signed)
Report given to Audrey RN

## 2013-02-21 NOTE — ED Notes (Signed)
Pt had back surgery on last Friday. She fell x 1 today. She is now complaining lower back pain that radiated to right leg. Family is complaining that pt is more confused. Will continue to monitor.

## 2013-02-21 NOTE — Discharge Instructions (Signed)
 Lumbosacral Strain Lumbosacral strain is one of the most common causes of back pain. There are many causes of back pain. Most are not serious conditions. CAUSES  Your backbone (spinal column) is made up of 24 main vertebral bodies, the sacrum, and the coccyx. These are held together by muscles and tough, fibrous tissue (ligaments). Nerve roots pass through the openings between the vertebrae. A sudden move or injury to the back may cause injury to, or pressure on, these nerves. This may result in localized back pain or pain movement (radiation) into the buttocks, down the leg, and into the foot. Sharp, shooting pain from the buttock down the back of the leg (sciatica) is frequently associated with a ruptured (herniated) disk. Pain may be caused by muscle spasm alone. Your caregiver can often find the cause of your pain by the details of your symptoms and an exam. In some cases, you may need tests (such as X-rays). Your caregiver will work with you to decide if any tests are needed based on your specific exam. HOME CARE INSTRUCTIONS   Avoid an underactive lifestyle. Active exercise, as directed by your caregiver, is your greatest weapon against back pain.  Avoid hard physical activities (tennis, racquetball, waterskiing) if you are not in proper physical condition for it. This may aggravate or create problems.  If you have a back problem, avoid sports requiring sudden body movements. Swimming and walking are generally safer activities.  Maintain good posture.  Avoid becoming overweight (obese).  Use bed rest for only the most extreme, sudden (acute) episode. Your caregiver will help you determine how much bed rest is necessary.  For acute conditions, you may put ice on the injured area.  Put ice in a plastic bag.  Place a towel between your skin and the bag.  Leave the ice on for 15-20 minutes at a time, every 2 hours, or as needed.  After you are improved and more active, it may help to  apply heat for 30 minutes before activities. See your caregiver if you are having pain that lasts longer than expected. Your caregiver can advise appropriate exercises or therapy if needed. With conditioning, most back problems can be avoided. SEEK IMMEDIATE MEDICAL CARE IF:   You have numbness, tingling, weakness, or problems with the use of your arms or legs.  You experience severe back pain not relieved with medicines.  There is a change in bowel or bladder control.  You have increasing pain in any area of the body, including your belly (abdomen).  You notice shortness of breath, dizziness, or feel faint.  You feel sick to your stomach (nauseous), are throwing up (vomiting), or become sweaty.  You notice discoloration of your toes or legs, or your feet get very cold.  Your back pain is getting worse.  You have a fever. MAKE SURE YOU:   Understand these instructions.  Will watch your condition.  Will get help right away if you are not doing well or get worse. Document Released: 06/20/2005 Document Revised: 12/03/2011 Document Reviewed: 12/10/2008 [facility] Patient Information 2014 [place], [place].   Narcotic and benzodiazepine use may cause drowsiness, slowed breathing or dependence.  Please use with caution and do not drive, operate machinery or watch young children alone while taking them.  Taking combinations of these medications or drinking alcohol will potentiate these effects.

## 2013-03-13 ENCOUNTER — Other Ambulatory Visit: Payer: Self-pay | Admitting: Neurological Surgery

## 2013-03-13 DIAGNOSIS — M5416 Radiculopathy, lumbar region: Secondary | ICD-10-CM

## 2013-03-16 ENCOUNTER — Ambulatory Visit
Admission: RE | Admit: 2013-03-16 | Discharge: 2013-03-16 | Disposition: A | Discharge status: Home / Self Care | Payer: Medicare Other | Source: Ambulatory Visit | Attending: Neurological Surgery | Admitting: Neurological Surgery

## 2013-03-16 DIAGNOSIS — M5416 Radiculopathy, lumbar region: Secondary | ICD-10-CM

## 2013-12-20 IMAGING — CT CT L SPINE W/O CM
4 of 10 series · 11 of 33 positions shown, 13 images · non-contrast
Comparison: 02/26/2013 and earlier studies

CLINICAL DATA: Back and right leg pain.  Postop.

CT LUMBAR SPINE WITHOUT CONTRAST
TECHNIQUE: Multidetector CT imaging of the lumbar spine was
performed without intravenous contrast administration. Multiplanar
CT image reconstructions were also generated.

[Series 2: l spine bone · axial · 0.27mm/px · z∈[-186,-99]mm · 2 of 106 slices shown, 3 images]
[im 36/106  soft-tissue]
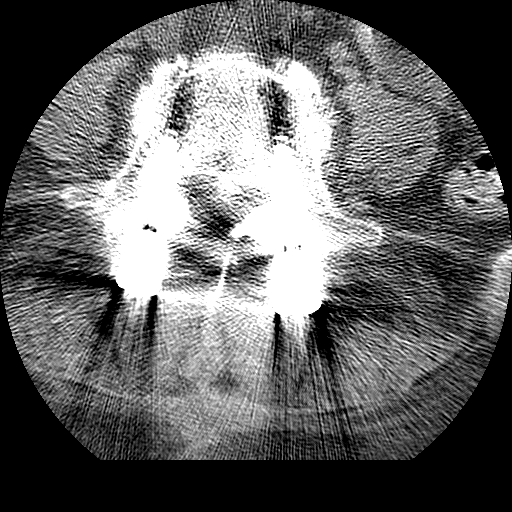
[im 36/106  bone]
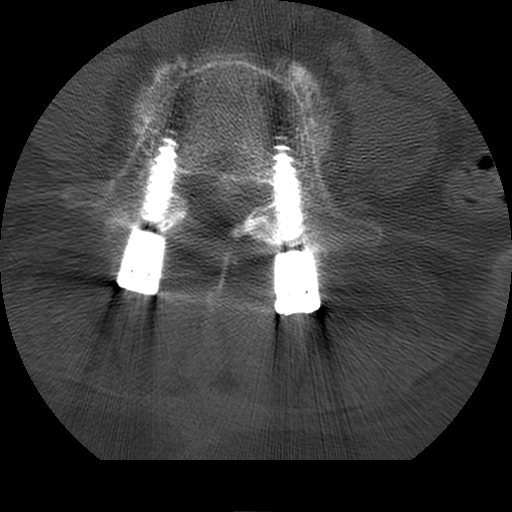
[im 71/106  bone]
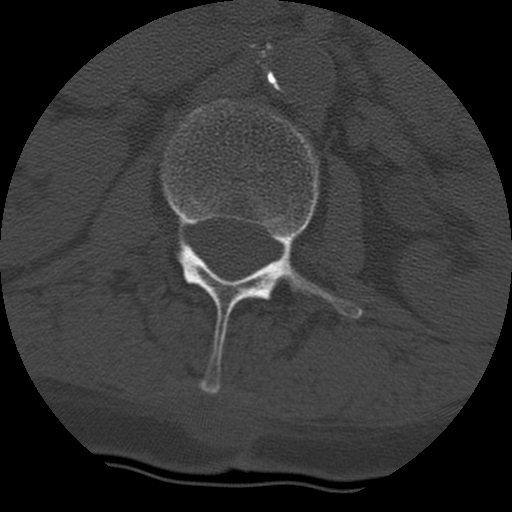

[Series 3: l spine soft · axial · 0.27mm/px · z∈[-209,-79]mm · 3 of 106 slices shown]
[im 27/106  soft-tissue]
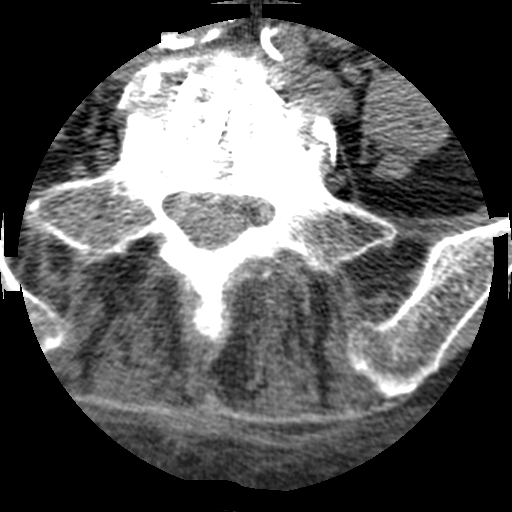
[im 53/106  soft-tissue]
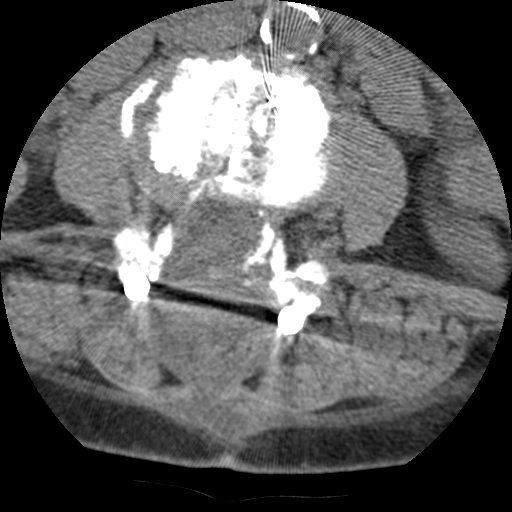
[im 79/106  soft-tissue]
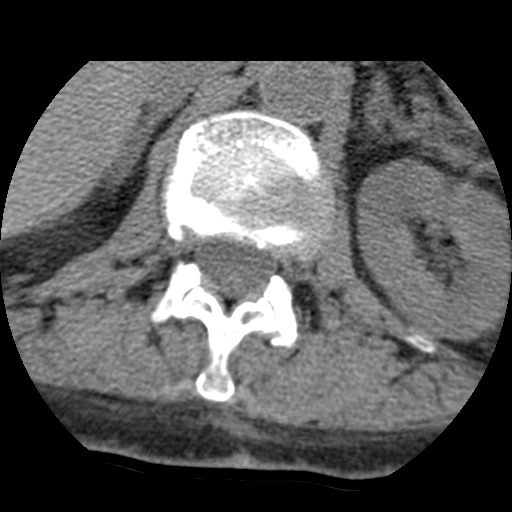

[Series 104: cor/lower · coronal · 0.27mm/px · 1 of 50 slices shown]
[im 25/50  bone]
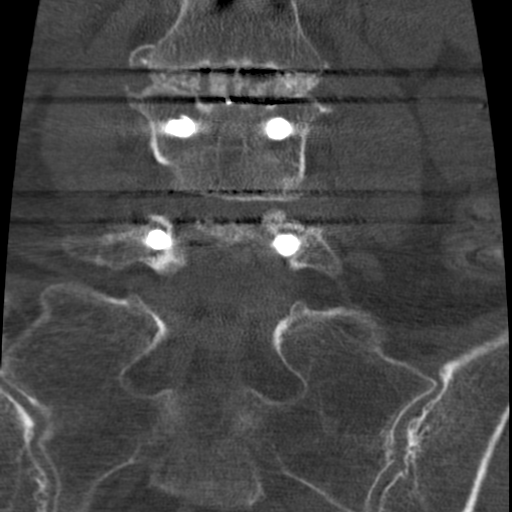

[Series 105: sag · sagittal · 0.53mm/px · 5 of 50 slices shown, 6 images]
[im 17/50  bone]
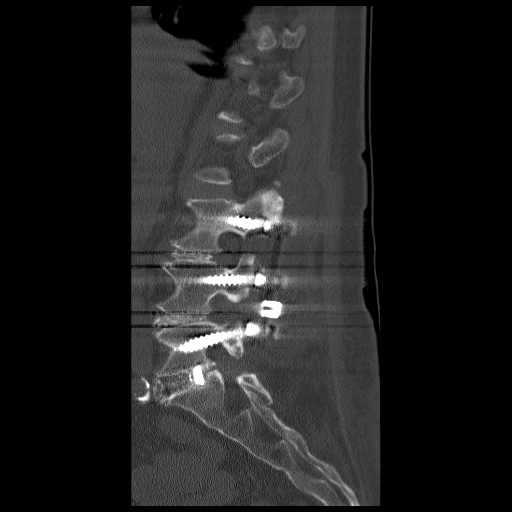
[im 21/50  bone]
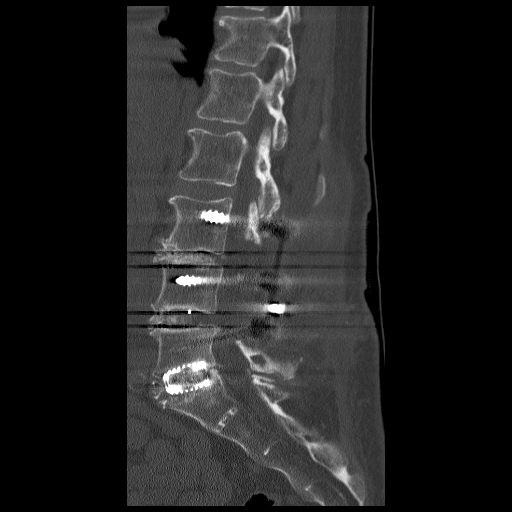
[im 25/50  soft-tissue]
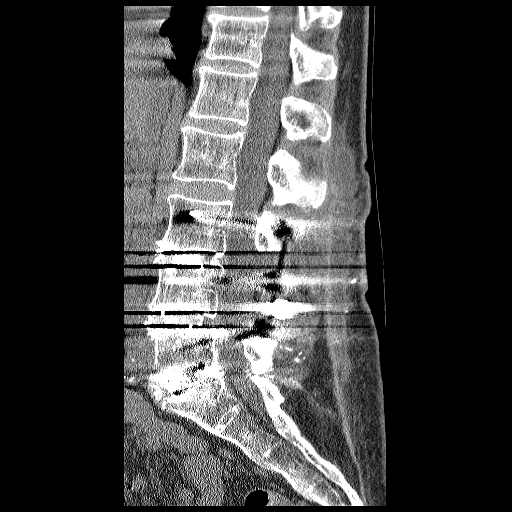
[im 25/50  bone]
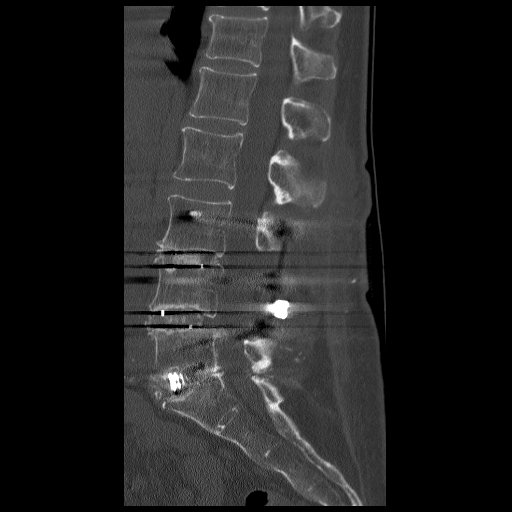
[im 29/50  bone]
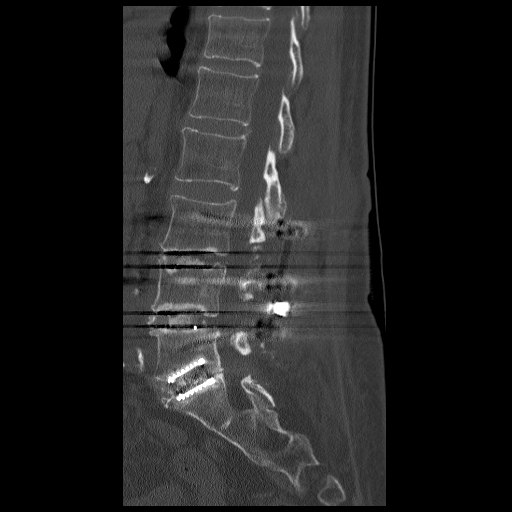
[im 33/50  bone]
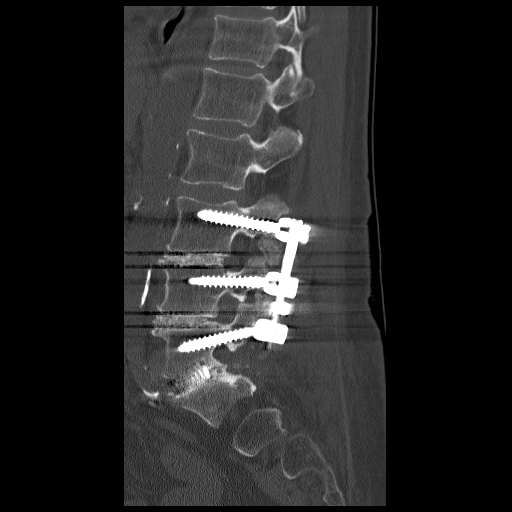

[11 of 33 positions shown; findings below may reference images not displayed]

FINDINGS: Numbering scheme follows that used on previous MRI of
09/03/2012, the 12 with hypoplastic ribs noted L1.  Normal
alignment.

T12-L1:  Unremarkable

L1-2:  Unremarkable interspace.  Mild left-sided facet degenerative
change.

L2-3:  Minimal circumferential disc bulge.  Mild bilateral facet
degenerative change.  Central canal and foramina patent.

L3-4:  Bilateral pedicle screws at each level with vertical
interconnecting hardware, intact without surrounding lucency.
There has been   posterior decompression.  Graft in the interspace
with   radiodense markers which result in some streak artifact.
Alignment is preserved.  Bone chips project at the inferior margin
of the left   foramen.  No foraminal or central canal stenosis is
evident.

L4-5:  Continuation of PLIF.  Bilateral L5 pedicle screws breach
the medial aspect of the pedicles by 1-2 mm.  Graft with markers in
the interspace, with suggestion of mild subsidence.  Posterior
decompression.

L5-S1:  Paired cages with graft material in the interspace. Lucency
can be seen through much of the interspace but there is no  vacuum
phenomenon or other evidence of   incomplete fusion.  Stable
subsidence into the adjacent endplates.  Bilateral decompressive
laminotomies.

Fairly extensive aortoiliac arterial calcifications.  Patchy
nonspecific airspace opacities in the visualized left lung base.
Remainder of visualized paraspinal soft tissues unremarkable.
IMPRESSION: 1.  Changes of PLIF L3-L5 with stable alignment.  A few bone chips
project at the inferior margin of the left L3-4 foramen.
2. Stable remote postoperative changes L5-S1.

## 2014-11-19 DIAGNOSIS — J449 Chronic obstructive pulmonary disease, unspecified: Secondary | ICD-10-CM | POA: Diagnosis not present

## 2014-11-19 DIAGNOSIS — F411 Generalized anxiety disorder: Secondary | ICD-10-CM | POA: Diagnosis not present

## 2014-11-19 DIAGNOSIS — E039 Hypothyroidism, unspecified: Secondary | ICD-10-CM | POA: Diagnosis not present

## 2014-11-19 DIAGNOSIS — Z1389 Encounter for screening for other disorder: Secondary | ICD-10-CM | POA: Diagnosis not present

## 2014-11-19 DIAGNOSIS — K219 Gastro-esophageal reflux disease without esophagitis: Secondary | ICD-10-CM | POA: Diagnosis not present

## 2014-12-06 DIAGNOSIS — Z0000 Encounter for general adult medical examination without abnormal findings: Secondary | ICD-10-CM | POA: Diagnosis not present

## 2014-12-06 DIAGNOSIS — Z87891 Personal history of nicotine dependence: Secondary | ICD-10-CM | POA: Diagnosis not present

## 2014-12-06 DIAGNOSIS — J449 Chronic obstructive pulmonary disease, unspecified: Secondary | ICD-10-CM | POA: Diagnosis not present

## 2014-12-06 DIAGNOSIS — Z720 Tobacco use: Secondary | ICD-10-CM | POA: Diagnosis not present

## 2014-12-06 DIAGNOSIS — E039 Hypothyroidism, unspecified: Secondary | ICD-10-CM | POA: Diagnosis not present

## 2014-12-06 DIAGNOSIS — M810 Age-related osteoporosis without current pathological fracture: Secondary | ICD-10-CM | POA: Diagnosis not present

## 2014-12-06 DIAGNOSIS — Z1389 Encounter for screening for other disorder: Secondary | ICD-10-CM | POA: Diagnosis not present

## 2014-12-06 DIAGNOSIS — R030 Elevated blood-pressure reading, without diagnosis of hypertension: Secondary | ICD-10-CM | POA: Diagnosis not present

## 2015-01-17 DIAGNOSIS — F411 Generalized anxiety disorder: Secondary | ICD-10-CM | POA: Diagnosis not present

## 2015-01-17 DIAGNOSIS — N951 Menopausal and female climacteric states: Secondary | ICD-10-CM | POA: Diagnosis not present

## 2015-04-06 DIAGNOSIS — H04123 Dry eye syndrome of bilateral lacrimal glands: Secondary | ICD-10-CM | POA: Diagnosis not present

## 2015-04-20 DIAGNOSIS — F411 Generalized anxiety disorder: Secondary | ICD-10-CM | POA: Diagnosis not present

## 2015-07-26 DIAGNOSIS — J449 Chronic obstructive pulmonary disease, unspecified: Secondary | ICD-10-CM | POA: Diagnosis not present

## 2015-08-12 DIAGNOSIS — M791 Myalgia: Secondary | ICD-10-CM | POA: Diagnosis not present

## 2015-08-12 DIAGNOSIS — R1013 Epigastric pain: Secondary | ICD-10-CM | POA: Diagnosis not present

## 2015-08-12 DIAGNOSIS — Z23 Encounter for immunization: Secondary | ICD-10-CM | POA: Diagnosis not present

## 2015-09-13 DIAGNOSIS — J0100 Acute maxillary sinusitis, unspecified: Secondary | ICD-10-CM | POA: Diagnosis not present

## 2015-09-20 DIAGNOSIS — Z1231 Encounter for screening mammogram for malignant neoplasm of breast: Secondary | ICD-10-CM | POA: Diagnosis not present

## 2015-10-13 DIAGNOSIS — L219 Seborrheic dermatitis, unspecified: Secondary | ICD-10-CM | POA: Diagnosis not present

## 2015-10-13 DIAGNOSIS — L299 Pruritus, unspecified: Secondary | ICD-10-CM | POA: Diagnosis not present

## 2015-11-22 DIAGNOSIS — K219 Gastro-esophageal reflux disease without esophagitis: Secondary | ICD-10-CM | POA: Diagnosis not present

## 2015-11-22 DIAGNOSIS — J410 Simple chronic bronchitis: Secondary | ICD-10-CM | POA: Diagnosis not present

## 2015-11-22 DIAGNOSIS — R5383 Other fatigue: Secondary | ICD-10-CM | POA: Diagnosis not present

## 2015-11-22 DIAGNOSIS — E038 Other specified hypothyroidism: Secondary | ICD-10-CM | POA: Diagnosis not present

## 2015-11-22 DIAGNOSIS — J449 Chronic obstructive pulmonary disease, unspecified: Secondary | ICD-10-CM | POA: Diagnosis not present

## 2015-11-22 DIAGNOSIS — R05 Cough: Secondary | ICD-10-CM | POA: Diagnosis not present

## 2015-12-08 DIAGNOSIS — H524 Presbyopia: Secondary | ICD-10-CM | POA: Diagnosis not present

## 2015-12-08 DIAGNOSIS — H04123 Dry eye syndrome of bilateral lacrimal glands: Secondary | ICD-10-CM | POA: Diagnosis not present

## 2015-12-20 DIAGNOSIS — J440 Chronic obstructive pulmonary disease with acute lower respiratory infection: Secondary | ICD-10-CM | POA: Diagnosis not present

## 2016-01-12 DIAGNOSIS — Z13820 Encounter for screening for osteoporosis: Secondary | ICD-10-CM | POA: Diagnosis not present

## 2016-01-12 DIAGNOSIS — Z0001 Encounter for general adult medical examination with abnormal findings: Secondary | ICD-10-CM | POA: Diagnosis not present

## 2016-01-12 DIAGNOSIS — Z1211 Encounter for screening for malignant neoplasm of colon: Secondary | ICD-10-CM | POA: Diagnosis not present

## 2016-01-12 DIAGNOSIS — R412 Retrograde amnesia: Secondary | ICD-10-CM | POA: Diagnosis not present

## 2016-01-12 DIAGNOSIS — Z1231 Encounter for screening mammogram for malignant neoplasm of breast: Secondary | ICD-10-CM | POA: Diagnosis not present

## 2016-01-16 DIAGNOSIS — Z1211 Encounter for screening for malignant neoplasm of colon: Secondary | ICD-10-CM | POA: Diagnosis not present

## 2016-02-02 DIAGNOSIS — R413 Other amnesia: Secondary | ICD-10-CM | POA: Diagnosis not present

## 2016-02-02 DIAGNOSIS — J410 Simple chronic bronchitis: Secondary | ICD-10-CM | POA: Diagnosis not present

## 2016-02-02 DIAGNOSIS — E038 Other specified hypothyroidism: Secondary | ICD-10-CM | POA: Diagnosis not present

## 2016-03-05 DIAGNOSIS — R413 Other amnesia: Secondary | ICD-10-CM | POA: Diagnosis not present

## 2016-03-05 DIAGNOSIS — A0839 Other viral enteritis: Secondary | ICD-10-CM | POA: Diagnosis not present

## 2016-04-16 DIAGNOSIS — K591 Functional diarrhea: Secondary | ICD-10-CM | POA: Diagnosis not present

## 2016-04-16 DIAGNOSIS — R10814 Left lower quadrant abdominal tenderness: Secondary | ICD-10-CM | POA: Diagnosis not present

## 2016-04-16 DIAGNOSIS — R1032 Left lower quadrant pain: Secondary | ICD-10-CM | POA: Diagnosis not present

## 2016-04-16 DIAGNOSIS — J410 Simple chronic bronchitis: Secondary | ICD-10-CM | POA: Diagnosis not present

## 2016-04-16 DIAGNOSIS — F5101 Primary insomnia: Secondary | ICD-10-CM | POA: Diagnosis not present

## 2016-04-16 DIAGNOSIS — K449 Diaphragmatic hernia without obstruction or gangrene: Secondary | ICD-10-CM | POA: Diagnosis not present

## 2016-04-16 DIAGNOSIS — R197 Diarrhea, unspecified: Secondary | ICD-10-CM | POA: Diagnosis not present

## 2016-04-16 DIAGNOSIS — J479 Bronchiectasis, uncomplicated: Secondary | ICD-10-CM | POA: Diagnosis not present

## 2016-04-18 DIAGNOSIS — R197 Diarrhea, unspecified: Secondary | ICD-10-CM | POA: Diagnosis not present

## 2016-04-18 DIAGNOSIS — K921 Melena: Secondary | ICD-10-CM | POA: Diagnosis not present

## 2016-04-30 DIAGNOSIS — N390 Urinary tract infection, site not specified: Secondary | ICD-10-CM | POA: Diagnosis not present

## 2016-05-18 DIAGNOSIS — I249 Acute ischemic heart disease, unspecified: Secondary | ICD-10-CM | POA: Diagnosis not present

## 2016-05-18 DIAGNOSIS — R1013 Epigastric pain: Secondary | ICD-10-CM | POA: Diagnosis not present

## 2016-05-18 DIAGNOSIS — R079 Chest pain, unspecified: Secondary | ICD-10-CM | POA: Diagnosis not present

## 2016-05-18 DIAGNOSIS — J449 Chronic obstructive pulmonary disease, unspecified: Secondary | ICD-10-CM | POA: Diagnosis not present

## 2016-06-12 DIAGNOSIS — R5383 Other fatigue: Secondary | ICD-10-CM | POA: Diagnosis not present

## 2016-06-12 DIAGNOSIS — Z0001 Encounter for general adult medical examination with abnormal findings: Secondary | ICD-10-CM | POA: Diagnosis not present

## 2016-06-12 DIAGNOSIS — E039 Hypothyroidism, unspecified: Secondary | ICD-10-CM | POA: Diagnosis not present

## 2016-06-12 DIAGNOSIS — J449 Chronic obstructive pulmonary disease, unspecified: Secondary | ICD-10-CM | POA: Diagnosis not present

## 2017-05-29 DIAGNOSIS — E039 Hypothyroidism, unspecified: Secondary | ICD-10-CM | POA: Diagnosis not present

## 2017-05-29 DIAGNOSIS — R109 Unspecified abdominal pain: Secondary | ICD-10-CM | POA: Diagnosis not present

## 2017-05-29 DIAGNOSIS — F329 Major depressive disorder, single episode, unspecified: Secondary | ICD-10-CM | POA: Diagnosis not present

## 2017-05-30 DIAGNOSIS — R109 Unspecified abdominal pain: Secondary | ICD-10-CM | POA: Diagnosis not present

## 2017-05-30 DIAGNOSIS — F329 Major depressive disorder, single episode, unspecified: Secondary | ICD-10-CM | POA: Diagnosis not present

## 2017-05-30 DIAGNOSIS — E039 Hypothyroidism, unspecified: Secondary | ICD-10-CM | POA: Diagnosis not present

## 2018-11-10 DIAGNOSIS — Z2821 Immunization not carried out because of patient refusal: Secondary | ICD-10-CM | POA: Diagnosis not present

## 2018-11-10 DIAGNOSIS — Z1331 Encounter for screening for depression: Secondary | ICD-10-CM | POA: Diagnosis not present

## 2018-11-10 DIAGNOSIS — L602 Onychogryphosis: Secondary | ICD-10-CM | POA: Diagnosis not present

## 2018-11-10 DIAGNOSIS — Z9181 History of falling: Secondary | ICD-10-CM | POA: Diagnosis not present

## 2018-11-10 DIAGNOSIS — E039 Hypothyroidism, unspecified: Secondary | ICD-10-CM | POA: Diagnosis not present

## 2018-11-10 DIAGNOSIS — Z79899 Other long term (current) drug therapy: Secondary | ICD-10-CM | POA: Diagnosis not present

## 2018-11-10 DIAGNOSIS — R413 Other amnesia: Secondary | ICD-10-CM | POA: Diagnosis not present

## 2018-11-22 DIAGNOSIS — R413 Other amnesia: Secondary | ICD-10-CM | POA: Diagnosis not present

## 2018-11-22 DIAGNOSIS — J449 Chronic obstructive pulmonary disease, unspecified: Secondary | ICD-10-CM | POA: Diagnosis not present

## 2018-11-22 DIAGNOSIS — F419 Anxiety disorder, unspecified: Secondary | ICD-10-CM | POA: Diagnosis not present

## 2018-11-22 DIAGNOSIS — E039 Hypothyroidism, unspecified: Secondary | ICD-10-CM | POA: Diagnosis not present

## 2018-11-22 DIAGNOSIS — Z9181 History of falling: Secondary | ICD-10-CM | POA: Diagnosis not present

## 2018-11-22 DIAGNOSIS — F329 Major depressive disorder, single episode, unspecified: Secondary | ICD-10-CM | POA: Diagnosis not present

## 2018-11-22 DIAGNOSIS — Z602 Problems related to living alone: Secondary | ICD-10-CM | POA: Diagnosis not present

## 2018-11-22 DIAGNOSIS — F17210 Nicotine dependence, cigarettes, uncomplicated: Secondary | ICD-10-CM | POA: Diagnosis not present

## 2018-11-25 DIAGNOSIS — Z9181 History of falling: Secondary | ICD-10-CM | POA: Diagnosis not present

## 2018-11-25 DIAGNOSIS — Z602 Problems related to living alone: Secondary | ICD-10-CM | POA: Diagnosis not present

## 2018-11-25 DIAGNOSIS — F17210 Nicotine dependence, cigarettes, uncomplicated: Secondary | ICD-10-CM | POA: Diagnosis not present

## 2018-11-25 DIAGNOSIS — F329 Major depressive disorder, single episode, unspecified: Secondary | ICD-10-CM | POA: Diagnosis not present

## 2018-11-25 DIAGNOSIS — J449 Chronic obstructive pulmonary disease, unspecified: Secondary | ICD-10-CM | POA: Diagnosis not present

## 2018-11-25 DIAGNOSIS — E039 Hypothyroidism, unspecified: Secondary | ICD-10-CM | POA: Diagnosis not present

## 2018-11-25 DIAGNOSIS — F419 Anxiety disorder, unspecified: Secondary | ICD-10-CM | POA: Diagnosis not present

## 2018-11-25 DIAGNOSIS — R413 Other amnesia: Secondary | ICD-10-CM | POA: Diagnosis not present

## 2018-11-27 DIAGNOSIS — E039 Hypothyroidism, unspecified: Secondary | ICD-10-CM | POA: Diagnosis not present

## 2018-11-27 DIAGNOSIS — F329 Major depressive disorder, single episode, unspecified: Secondary | ICD-10-CM | POA: Diagnosis not present

## 2018-11-27 DIAGNOSIS — F17210 Nicotine dependence, cigarettes, uncomplicated: Secondary | ICD-10-CM | POA: Diagnosis not present

## 2018-11-27 DIAGNOSIS — R413 Other amnesia: Secondary | ICD-10-CM | POA: Diagnosis not present

## 2018-11-27 DIAGNOSIS — Z602 Problems related to living alone: Secondary | ICD-10-CM | POA: Diagnosis not present

## 2018-11-27 DIAGNOSIS — J449 Chronic obstructive pulmonary disease, unspecified: Secondary | ICD-10-CM | POA: Diagnosis not present

## 2018-11-27 DIAGNOSIS — F419 Anxiety disorder, unspecified: Secondary | ICD-10-CM | POA: Diagnosis not present

## 2018-11-27 DIAGNOSIS — Z9181 History of falling: Secondary | ICD-10-CM | POA: Diagnosis not present

## 2018-11-28 DIAGNOSIS — J449 Chronic obstructive pulmonary disease, unspecified: Secondary | ICD-10-CM | POA: Diagnosis not present

## 2018-11-28 DIAGNOSIS — R413 Other amnesia: Secondary | ICD-10-CM | POA: Diagnosis not present

## 2018-11-28 DIAGNOSIS — F17210 Nicotine dependence, cigarettes, uncomplicated: Secondary | ICD-10-CM | POA: Diagnosis not present

## 2018-11-28 DIAGNOSIS — Z9181 History of falling: Secondary | ICD-10-CM | POA: Diagnosis not present

## 2018-11-28 DIAGNOSIS — E039 Hypothyroidism, unspecified: Secondary | ICD-10-CM | POA: Diagnosis not present

## 2018-11-28 DIAGNOSIS — F329 Major depressive disorder, single episode, unspecified: Secondary | ICD-10-CM | POA: Diagnosis not present

## 2018-11-28 DIAGNOSIS — F419 Anxiety disorder, unspecified: Secondary | ICD-10-CM | POA: Diagnosis not present

## 2018-11-28 DIAGNOSIS — Z602 Problems related to living alone: Secondary | ICD-10-CM | POA: Diagnosis not present

## 2018-12-02 DIAGNOSIS — F17210 Nicotine dependence, cigarettes, uncomplicated: Secondary | ICD-10-CM | POA: Diagnosis not present

## 2018-12-02 DIAGNOSIS — Z9181 History of falling: Secondary | ICD-10-CM | POA: Diagnosis not present

## 2018-12-02 DIAGNOSIS — F329 Major depressive disorder, single episode, unspecified: Secondary | ICD-10-CM | POA: Diagnosis not present

## 2018-12-02 DIAGNOSIS — F419 Anxiety disorder, unspecified: Secondary | ICD-10-CM | POA: Diagnosis not present

## 2018-12-02 DIAGNOSIS — Z602 Problems related to living alone: Secondary | ICD-10-CM | POA: Diagnosis not present

## 2018-12-02 DIAGNOSIS — E039 Hypothyroidism, unspecified: Secondary | ICD-10-CM | POA: Diagnosis not present

## 2018-12-02 DIAGNOSIS — R413 Other amnesia: Secondary | ICD-10-CM | POA: Diagnosis not present

## 2018-12-02 DIAGNOSIS — J449 Chronic obstructive pulmonary disease, unspecified: Secondary | ICD-10-CM | POA: Diagnosis not present

## 2018-12-03 ENCOUNTER — Encounter: Payer: Self-pay | Admitting: Sports Medicine

## 2018-12-03 ENCOUNTER — Ambulatory Visit: Payer: Medicare HMO | Admitting: Sports Medicine

## 2018-12-03 ENCOUNTER — Other Ambulatory Visit: Payer: Self-pay

## 2018-12-03 VITALS — BP 120/68 | HR 86 | Resp 16

## 2018-12-03 DIAGNOSIS — M79674 Pain in right toe(s): Secondary | ICD-10-CM | POA: Diagnosis not present

## 2018-12-03 DIAGNOSIS — B351 Tinea unguium: Secondary | ICD-10-CM | POA: Diagnosis not present

## 2018-12-03 DIAGNOSIS — F419 Anxiety disorder, unspecified: Secondary | ICD-10-CM | POA: Diagnosis not present

## 2018-12-03 DIAGNOSIS — Z602 Problems related to living alone: Secondary | ICD-10-CM | POA: Diagnosis not present

## 2018-12-03 DIAGNOSIS — R413 Other amnesia: Secondary | ICD-10-CM | POA: Diagnosis not present

## 2018-12-03 DIAGNOSIS — J449 Chronic obstructive pulmonary disease, unspecified: Secondary | ICD-10-CM | POA: Diagnosis not present

## 2018-12-03 DIAGNOSIS — E039 Hypothyroidism, unspecified: Secondary | ICD-10-CM | POA: Diagnosis not present

## 2018-12-03 DIAGNOSIS — M79675 Pain in left toe(s): Secondary | ICD-10-CM | POA: Diagnosis not present

## 2018-12-03 DIAGNOSIS — F17210 Nicotine dependence, cigarettes, uncomplicated: Secondary | ICD-10-CM | POA: Diagnosis not present

## 2018-12-03 DIAGNOSIS — Z9181 History of falling: Secondary | ICD-10-CM | POA: Diagnosis not present

## 2018-12-03 DIAGNOSIS — F329 Major depressive disorder, single episode, unspecified: Secondary | ICD-10-CM | POA: Diagnosis not present

## 2018-12-03 NOTE — Progress Notes (Signed)
Subjective: Pamela Guerrero is a 78 y.o. female patient seen today in office with complaint of mildly painful thickened and elongated toenails; unable to trim. Patient is assisted by granddaughter who helps to report this concern and history.  Patient is not diabetic and is not on blood thinners.  Denies history of Diabetes, Neuropathy, or Vascular disease.  Reports that it is difficult to trim her toenails due to the thickness and curvature especially on her left greater than right third toenail.  Patient has no other pedal complaints at this time.   Review of Systems  Unable to perform ROS: Dementia     Patient Active Problem List   Diagnosis Date Noted  . Spinal stenosis of lumbar region L3-4 L4-5 02/13/2013    No current outpatient medications on file prior to visit.   No current facility-administered medications on file prior to visit.     Allergies  Allergen Reactions  . Morphine And Related Other (See Comments)    Confusion  . Prednisone Other (See Comments)     Hallucinations  . Codeine Itching  . Sulfa Antibiotics     Objective: Physical Exam  General: Well developed, nourished, no acute distress, awake, alert and oriented x 2 possible dementia   Vascular: Dorsalis pedis artery 1/4 bilateral, Posterior tibial artery 1/4 bilateral, skin temperature warm to warm proximal to distal bilateral lower extremities, ++ varicosities, scant pedal hair present bilateral.  Neurological: Gross sensation present via light touch bilateral.   Dermatological: Skin is warm, dry, and supple bilateral, Nails 1-10 are tender, long, thick, and discolored with mild subungal debris, no webspace macerations present bilateral, no open lesions present bilateral, no callus/corns/hyperkeratotic tissue present bilateral. No signs of infection bilateral.  Musculoskeletal: Asymptomatic Bunion and hammertoe boney deformities noted bilateral. Muscular strength within normal limits without painon range of  motion. No pain with calf compression bilateral.  Assessment and Plan:  Problem List Items Addressed This Visit    None    Visit Diagnoses    Pain due to onychomycosis of toenails of both feet    -  Primary      -Examined patient.  -Discussed treatment options for painful mycotic nails. -Mechanically debrided and reduced mycotic nails with sterile nail nipper and dremel nail file without incident. -Patient to return in 3 months for follow up evaluation or sooner if symptoms worsen.  Asencion Islam, DPM

## 2018-12-04 DIAGNOSIS — R413 Other amnesia: Secondary | ICD-10-CM | POA: Diagnosis not present

## 2018-12-04 DIAGNOSIS — Z9181 History of falling: Secondary | ICD-10-CM | POA: Diagnosis not present

## 2018-12-04 DIAGNOSIS — F419 Anxiety disorder, unspecified: Secondary | ICD-10-CM | POA: Diagnosis not present

## 2018-12-04 DIAGNOSIS — F17210 Nicotine dependence, cigarettes, uncomplicated: Secondary | ICD-10-CM | POA: Diagnosis not present

## 2018-12-04 DIAGNOSIS — Z602 Problems related to living alone: Secondary | ICD-10-CM | POA: Diagnosis not present

## 2018-12-04 DIAGNOSIS — F329 Major depressive disorder, single episode, unspecified: Secondary | ICD-10-CM | POA: Diagnosis not present

## 2018-12-04 DIAGNOSIS — E039 Hypothyroidism, unspecified: Secondary | ICD-10-CM | POA: Diagnosis not present

## 2018-12-04 DIAGNOSIS — J449 Chronic obstructive pulmonary disease, unspecified: Secondary | ICD-10-CM | POA: Diagnosis not present

## 2018-12-08 DIAGNOSIS — J449 Chronic obstructive pulmonary disease, unspecified: Secondary | ICD-10-CM | POA: Diagnosis not present

## 2018-12-08 DIAGNOSIS — Z9181 History of falling: Secondary | ICD-10-CM | POA: Diagnosis not present

## 2018-12-08 DIAGNOSIS — F329 Major depressive disorder, single episode, unspecified: Secondary | ICD-10-CM | POA: Diagnosis not present

## 2018-12-08 DIAGNOSIS — R413 Other amnesia: Secondary | ICD-10-CM | POA: Diagnosis not present

## 2018-12-08 DIAGNOSIS — Z602 Problems related to living alone: Secondary | ICD-10-CM | POA: Diagnosis not present

## 2018-12-08 DIAGNOSIS — F17210 Nicotine dependence, cigarettes, uncomplicated: Secondary | ICD-10-CM | POA: Diagnosis not present

## 2018-12-08 DIAGNOSIS — F419 Anxiety disorder, unspecified: Secondary | ICD-10-CM | POA: Diagnosis not present

## 2018-12-08 DIAGNOSIS — E039 Hypothyroidism, unspecified: Secondary | ICD-10-CM | POA: Diagnosis not present

## 2018-12-10 DIAGNOSIS — R413 Other amnesia: Secondary | ICD-10-CM | POA: Diagnosis not present

## 2018-12-10 DIAGNOSIS — Z0000 Encounter for general adult medical examination without abnormal findings: Secondary | ICD-10-CM | POA: Diagnosis not present

## 2018-12-10 DIAGNOSIS — E785 Hyperlipidemia, unspecified: Secondary | ICD-10-CM | POA: Diagnosis not present

## 2018-12-10 DIAGNOSIS — F329 Major depressive disorder, single episode, unspecified: Secondary | ICD-10-CM | POA: Diagnosis not present

## 2018-12-10 DIAGNOSIS — F17210 Nicotine dependence, cigarettes, uncomplicated: Secondary | ICD-10-CM | POA: Diagnosis not present

## 2018-12-10 DIAGNOSIS — F0391 Unspecified dementia with behavioral disturbance: Secondary | ICD-10-CM | POA: Diagnosis not present

## 2018-12-10 DIAGNOSIS — F419 Anxiety disorder, unspecified: Secondary | ICD-10-CM | POA: Diagnosis not present

## 2018-12-10 DIAGNOSIS — J449 Chronic obstructive pulmonary disease, unspecified: Secondary | ICD-10-CM | POA: Diagnosis not present

## 2018-12-10 DIAGNOSIS — Z9181 History of falling: Secondary | ICD-10-CM | POA: Diagnosis not present

## 2018-12-10 DIAGNOSIS — Z602 Problems related to living alone: Secondary | ICD-10-CM | POA: Diagnosis not present

## 2018-12-10 DIAGNOSIS — E039 Hypothyroidism, unspecified: Secondary | ICD-10-CM | POA: Diagnosis not present

## 2018-12-10 DIAGNOSIS — Z1331 Encounter for screening for depression: Secondary | ICD-10-CM | POA: Diagnosis not present

## 2018-12-10 DIAGNOSIS — Z1339 Encounter for screening examination for other mental health and behavioral disorders: Secondary | ICD-10-CM | POA: Diagnosis not present

## 2018-12-10 DIAGNOSIS — Z136 Encounter for screening for cardiovascular disorders: Secondary | ICD-10-CM | POA: Diagnosis not present

## 2018-12-16 DIAGNOSIS — F419 Anxiety disorder, unspecified: Secondary | ICD-10-CM | POA: Diagnosis not present

## 2018-12-16 DIAGNOSIS — Z602 Problems related to living alone: Secondary | ICD-10-CM | POA: Diagnosis not present

## 2018-12-16 DIAGNOSIS — J449 Chronic obstructive pulmonary disease, unspecified: Secondary | ICD-10-CM | POA: Diagnosis not present

## 2018-12-16 DIAGNOSIS — E039 Hypothyroidism, unspecified: Secondary | ICD-10-CM | POA: Diagnosis not present

## 2018-12-16 DIAGNOSIS — F17210 Nicotine dependence, cigarettes, uncomplicated: Secondary | ICD-10-CM | POA: Diagnosis not present

## 2018-12-16 DIAGNOSIS — F329 Major depressive disorder, single episode, unspecified: Secondary | ICD-10-CM | POA: Diagnosis not present

## 2018-12-16 DIAGNOSIS — R413 Other amnesia: Secondary | ICD-10-CM | POA: Diagnosis not present

## 2018-12-16 DIAGNOSIS — Z9181 History of falling: Secondary | ICD-10-CM | POA: Diagnosis not present

## 2019-01-01 DIAGNOSIS — E039 Hypothyroidism, unspecified: Secondary | ICD-10-CM | POA: Diagnosis not present

## 2019-01-01 DIAGNOSIS — Z9181 History of falling: Secondary | ICD-10-CM | POA: Diagnosis not present

## 2019-01-01 DIAGNOSIS — J449 Chronic obstructive pulmonary disease, unspecified: Secondary | ICD-10-CM | POA: Diagnosis not present

## 2019-01-01 DIAGNOSIS — R413 Other amnesia: Secondary | ICD-10-CM | POA: Diagnosis not present

## 2019-01-01 DIAGNOSIS — F419 Anxiety disorder, unspecified: Secondary | ICD-10-CM | POA: Diagnosis not present

## 2019-01-01 DIAGNOSIS — F329 Major depressive disorder, single episode, unspecified: Secondary | ICD-10-CM | POA: Diagnosis not present

## 2019-01-01 DIAGNOSIS — Z602 Problems related to living alone: Secondary | ICD-10-CM | POA: Diagnosis not present

## 2019-01-01 DIAGNOSIS — F17210 Nicotine dependence, cigarettes, uncomplicated: Secondary | ICD-10-CM | POA: Diagnosis not present

## 2019-01-14 DIAGNOSIS — J449 Chronic obstructive pulmonary disease, unspecified: Secondary | ICD-10-CM | POA: Diagnosis not present

## 2019-01-19 DIAGNOSIS — F329 Major depressive disorder, single episode, unspecified: Secondary | ICD-10-CM | POA: Diagnosis not present

## 2019-01-19 DIAGNOSIS — R413 Other amnesia: Secondary | ICD-10-CM | POA: Diagnosis not present

## 2019-01-19 DIAGNOSIS — E039 Hypothyroidism, unspecified: Secondary | ICD-10-CM | POA: Diagnosis not present

## 2019-01-19 DIAGNOSIS — Z9181 History of falling: Secondary | ICD-10-CM | POA: Diagnosis not present

## 2019-01-19 DIAGNOSIS — F17210 Nicotine dependence, cigarettes, uncomplicated: Secondary | ICD-10-CM | POA: Diagnosis not present

## 2019-01-19 DIAGNOSIS — J449 Chronic obstructive pulmonary disease, unspecified: Secondary | ICD-10-CM | POA: Diagnosis not present

## 2019-01-19 DIAGNOSIS — Z602 Problems related to living alone: Secondary | ICD-10-CM | POA: Diagnosis not present

## 2019-01-19 DIAGNOSIS — F419 Anxiety disorder, unspecified: Secondary | ICD-10-CM | POA: Diagnosis not present

## 2019-02-13 DIAGNOSIS — J449 Chronic obstructive pulmonary disease, unspecified: Secondary | ICD-10-CM | POA: Diagnosis not present

## 2019-02-24 DIAGNOSIS — J0100 Acute maxillary sinusitis, unspecified: Secondary | ICD-10-CM | POA: Diagnosis not present

## 2019-02-24 DIAGNOSIS — J209 Acute bronchitis, unspecified: Secondary | ICD-10-CM | POA: Diagnosis not present

## 2019-02-25 DIAGNOSIS — Z20828 Contact with and (suspected) exposure to other viral communicable diseases: Secondary | ICD-10-CM | POA: Diagnosis not present

## 2019-02-25 DIAGNOSIS — R509 Fever, unspecified: Secondary | ICD-10-CM | POA: Diagnosis not present

## 2019-02-28 DIAGNOSIS — B0239 Other herpes zoster eye disease: Secondary | ICD-10-CM | POA: Diagnosis not present

## 2019-03-02 DIAGNOSIS — B029 Zoster without complications: Secondary | ICD-10-CM | POA: Diagnosis not present

## 2019-03-03 DIAGNOSIS — B0239 Other herpes zoster eye disease: Secondary | ICD-10-CM | POA: Diagnosis not present

## 2019-03-04 ENCOUNTER — Ambulatory Visit: Payer: Medicare HMO | Admitting: Sports Medicine

## 2019-03-12 DIAGNOSIS — J449 Chronic obstructive pulmonary disease, unspecified: Secondary | ICD-10-CM | POA: Diagnosis not present

## 2019-03-12 DIAGNOSIS — J690 Pneumonitis due to inhalation of food and vomit: Secondary | ICD-10-CM | POA: Diagnosis not present

## 2019-03-16 DIAGNOSIS — J449 Chronic obstructive pulmonary disease, unspecified: Secondary | ICD-10-CM | POA: Diagnosis not present

## 2019-03-17 DIAGNOSIS — M542 Cervicalgia: Secondary | ICD-10-CM | POA: Diagnosis not present

## 2019-03-19 ENCOUNTER — Other Ambulatory Visit: Payer: Self-pay

## 2019-03-19 ENCOUNTER — Ambulatory Visit (INDEPENDENT_AMBULATORY_CARE_PROVIDER_SITE_OTHER): Payer: Medicare HMO | Admitting: Sports Medicine

## 2019-03-19 ENCOUNTER — Encounter: Payer: Self-pay | Admitting: Sports Medicine

## 2019-03-19 DIAGNOSIS — M79674 Pain in right toe(s): Secondary | ICD-10-CM

## 2019-03-19 DIAGNOSIS — B351 Tinea unguium: Secondary | ICD-10-CM | POA: Diagnosis not present

## 2019-03-19 DIAGNOSIS — M79675 Pain in left toe(s): Secondary | ICD-10-CM | POA: Diagnosis not present

## 2019-03-19 NOTE — Progress Notes (Signed)
Subjective: Pamela Guerrero is a 78 y.o. female patient seen today in office with complaint of mildly painful thickened and elongated toenails; unable to trim. Patient is assisted by nursing aid who helps to report history as follows.  Patient is not diabetic and is not on blood thinners. No changes with medications or health hsitory.  Patient has no other pedal complaints at this time.     Patient Active Problem List   Diagnosis Date Noted  . Spinal stenosis of lumbar region L3-4 L4-5 02/13/2013    No current outpatient medications on file prior to visit.   No current facility-administered medications on file prior to visit.     Allergies  Allergen Reactions  . Morphine And Related Other (See Comments)    Confusion  . Prednisone Other (See Comments)     Hallucinations  . Codeine Itching  . Sulfa Antibiotics     Objective: Physical Exam  General: Well developed, nourished, no acute distress, awake, alert and oriented x 2 possible dementia   Vascular: Dorsalis pedis artery 1/4 bilateral, Posterior tibial artery 1/4 bilateral, skin temperature warm to warm proximal to distal bilateral lower extremities, ++ varicosities, scant pedal hair present bilateral.  Neurological: Gross sensation present via light touch bilateral.   Dermatological: Skin is warm, dry, and supple bilateral, Nails 1-10 are tender, long, thick, and discolored with mild subungal debris, no webspace macerations present bilateral, no open lesions present bilateral, no callus/corns/hyperkeratotic tissue present bilateral. No signs of infection bilateral.  Musculoskeletal: Asymptomatic Bunion and hammertoe boney deformities noted bilateral. Muscular strength within normal limits without painon range of motion. No pain with calf compression bilateral.  Assessment and Plan:  Problem List Items Addressed This Visit    None    Visit Diagnoses    Pain due to onychomycosis of toenails of both feet    -  Primary       -Examined patient.  -Discussed treatment options for painful mycotic nails. -Mechanically debrided and reduced mycotic nails with sterile nail nipper and dremel nail file without incident. -Patient to return in 3 months for follow up evaluation or sooner if symptoms worsen.  Landis Martins, DPM

## 2019-04-15 DIAGNOSIS — J449 Chronic obstructive pulmonary disease, unspecified: Secondary | ICD-10-CM | POA: Diagnosis not present

## 2019-04-22 DIAGNOSIS — R410 Disorientation, unspecified: Secondary | ICD-10-CM | POA: Diagnosis not present

## 2019-04-22 DIAGNOSIS — F0391 Unspecified dementia with behavioral disturbance: Secondary | ICD-10-CM | POA: Diagnosis not present

## 2019-04-23 DIAGNOSIS — H524 Presbyopia: Secondary | ICD-10-CM | POA: Diagnosis not present

## 2019-04-23 DIAGNOSIS — H353131 Nonexudative age-related macular degeneration, bilateral, early dry stage: Secondary | ICD-10-CM | POA: Diagnosis not present

## 2019-04-23 DIAGNOSIS — Z961 Presence of intraocular lens: Secondary | ICD-10-CM | POA: Diagnosis not present

## 2019-04-27 DIAGNOSIS — G4700 Insomnia, unspecified: Secondary | ICD-10-CM | POA: Diagnosis not present

## 2019-04-27 DIAGNOSIS — S61401A Unspecified open wound of right hand, initial encounter: Secondary | ICD-10-CM | POA: Diagnosis not present

## 2019-04-28 DIAGNOSIS — H524 Presbyopia: Secondary | ICD-10-CM | POA: Diagnosis not present

## 2019-04-28 DIAGNOSIS — Z961 Presence of intraocular lens: Secondary | ICD-10-CM | POA: Diagnosis not present

## 2019-04-28 DIAGNOSIS — H353131 Nonexudative age-related macular degeneration, bilateral, early dry stage: Secondary | ICD-10-CM | POA: Diagnosis not present

## 2019-05-16 DIAGNOSIS — J449 Chronic obstructive pulmonary disease, unspecified: Secondary | ICD-10-CM | POA: Diagnosis not present

## 2019-05-27 DIAGNOSIS — F0280 Dementia in other diseases classified elsewhere without behavioral disturbance: Secondary | ICD-10-CM | POA: Diagnosis not present

## 2019-05-27 DIAGNOSIS — E559 Vitamin D deficiency, unspecified: Secondary | ICD-10-CM | POA: Diagnosis not present

## 2019-05-27 DIAGNOSIS — Z79899 Other long term (current) drug therapy: Secondary | ICD-10-CM | POA: Diagnosis not present

## 2019-05-27 DIAGNOSIS — G309 Alzheimer's disease, unspecified: Secondary | ICD-10-CM | POA: Diagnosis not present

## 2019-05-27 DIAGNOSIS — Z139 Encounter for screening, unspecified: Secondary | ICD-10-CM | POA: Diagnosis not present

## 2019-05-27 DIAGNOSIS — J449 Chronic obstructive pulmonary disease, unspecified: Secondary | ICD-10-CM | POA: Diagnosis not present

## 2019-06-09 DIAGNOSIS — F419 Anxiety disorder, unspecified: Secondary | ICD-10-CM | POA: Diagnosis not present

## 2019-06-16 DIAGNOSIS — J449 Chronic obstructive pulmonary disease, unspecified: Secondary | ICD-10-CM | POA: Diagnosis not present

## 2019-06-18 ENCOUNTER — Ambulatory Visit: Payer: Medicare HMO | Admitting: Sports Medicine

## 2019-06-22 DIAGNOSIS — J9811 Atelectasis: Secondary | ICD-10-CM | POA: Diagnosis not present

## 2019-06-22 DIAGNOSIS — E039 Hypothyroidism, unspecified: Secondary | ICD-10-CM | POA: Diagnosis not present

## 2019-06-22 DIAGNOSIS — R0989 Other specified symptoms and signs involving the circulatory and respiratory systems: Secondary | ICD-10-CM | POA: Diagnosis not present

## 2019-06-22 DIAGNOSIS — R05 Cough: Secondary | ICD-10-CM | POA: Diagnosis not present

## 2019-06-22 DIAGNOSIS — F0391 Unspecified dementia with behavioral disturbance: Secondary | ICD-10-CM | POA: Diagnosis not present

## 2019-07-02 DIAGNOSIS — E039 Hypothyroidism, unspecified: Secondary | ICD-10-CM | POA: Diagnosis not present

## 2019-07-02 DIAGNOSIS — F0391 Unspecified dementia with behavioral disturbance: Secondary | ICD-10-CM | POA: Diagnosis not present

## 2019-07-02 DIAGNOSIS — F419 Anxiety disorder, unspecified: Secondary | ICD-10-CM | POA: Diagnosis not present

## 2019-07-02 DIAGNOSIS — J449 Chronic obstructive pulmonary disease, unspecified: Secondary | ICD-10-CM | POA: Diagnosis not present

## 2019-07-03 DIAGNOSIS — R918 Other nonspecific abnormal finding of lung field: Secondary | ICD-10-CM | POA: Diagnosis not present

## 2019-07-06 DIAGNOSIS — J189 Pneumonia, unspecified organism: Secondary | ICD-10-CM | POA: Diagnosis not present

## 2019-07-06 DIAGNOSIS — F0391 Unspecified dementia with behavioral disturbance: Secondary | ICD-10-CM | POA: Diagnosis not present

## 2019-07-06 DIAGNOSIS — J449 Chronic obstructive pulmonary disease, unspecified: Secondary | ICD-10-CM | POA: Diagnosis not present

## 2019-07-06 DIAGNOSIS — E039 Hypothyroidism, unspecified: Secondary | ICD-10-CM | POA: Diagnosis not present

## 2019-07-09 DIAGNOSIS — J449 Chronic obstructive pulmonary disease, unspecified: Secondary | ICD-10-CM | POA: Diagnosis not present

## 2019-07-09 DIAGNOSIS — F419 Anxiety disorder, unspecified: Secondary | ICD-10-CM | POA: Diagnosis not present

## 2019-07-09 DIAGNOSIS — E039 Hypothyroidism, unspecified: Secondary | ICD-10-CM | POA: Diagnosis not present

## 2019-07-09 DIAGNOSIS — I10 Essential (primary) hypertension: Secondary | ICD-10-CM | POA: Diagnosis not present

## 2019-07-09 DIAGNOSIS — G309 Alzheimer's disease, unspecified: Secondary | ICD-10-CM | POA: Diagnosis not present

## 2019-07-15 DIAGNOSIS — Z23 Encounter for immunization: Secondary | ICD-10-CM | POA: Diagnosis not present

## 2019-07-16 DIAGNOSIS — J449 Chronic obstructive pulmonary disease, unspecified: Secondary | ICD-10-CM | POA: Diagnosis not present

## 2019-07-20 DIAGNOSIS — Z23 Encounter for immunization: Secondary | ICD-10-CM | POA: Diagnosis not present

## 2019-07-22 DIAGNOSIS — I10 Essential (primary) hypertension: Secondary | ICD-10-CM | POA: Diagnosis not present

## 2019-07-22 DIAGNOSIS — E039 Hypothyroidism, unspecified: Secondary | ICD-10-CM | POA: Diagnosis not present

## 2019-07-22 DIAGNOSIS — J449 Chronic obstructive pulmonary disease, unspecified: Secondary | ICD-10-CM | POA: Diagnosis not present

## 2019-07-22 DIAGNOSIS — E559 Vitamin D deficiency, unspecified: Secondary | ICD-10-CM | POA: Diagnosis not present

## 2019-07-24 DIAGNOSIS — J449 Chronic obstructive pulmonary disease, unspecified: Secondary | ICD-10-CM | POA: Diagnosis not present

## 2019-07-24 DIAGNOSIS — E039 Hypothyroidism, unspecified: Secondary | ICD-10-CM | POA: Diagnosis not present

## 2019-07-24 DIAGNOSIS — I10 Essential (primary) hypertension: Secondary | ICD-10-CM | POA: Diagnosis not present

## 2019-07-24 DIAGNOSIS — E559 Vitamin D deficiency, unspecified: Secondary | ICD-10-CM | POA: Diagnosis not present

## 2019-07-24 DIAGNOSIS — R109 Unspecified abdominal pain: Secondary | ICD-10-CM | POA: Diagnosis not present

## 2019-07-30 DIAGNOSIS — I10 Essential (primary) hypertension: Secondary | ICD-10-CM | POA: Diagnosis not present

## 2019-07-30 DIAGNOSIS — F419 Anxiety disorder, unspecified: Secondary | ICD-10-CM | POA: Diagnosis not present

## 2019-07-30 DIAGNOSIS — G309 Alzheimer's disease, unspecified: Secondary | ICD-10-CM | POA: Diagnosis not present

## 2019-07-30 DIAGNOSIS — J449 Chronic obstructive pulmonary disease, unspecified: Secondary | ICD-10-CM | POA: Diagnosis not present

## 2019-07-30 DIAGNOSIS — E039 Hypothyroidism, unspecified: Secondary | ICD-10-CM | POA: Diagnosis not present

## 2019-08-16 DIAGNOSIS — J449 Chronic obstructive pulmonary disease, unspecified: Secondary | ICD-10-CM | POA: Diagnosis not present

## 2019-08-24 DIAGNOSIS — Z20828 Contact with and (suspected) exposure to other viral communicable diseases: Secondary | ICD-10-CM | POA: Diagnosis not present

## 2019-08-24 DIAGNOSIS — J449 Chronic obstructive pulmonary disease, unspecified: Secondary | ICD-10-CM | POA: Diagnosis not present

## 2019-08-26 DIAGNOSIS — J449 Chronic obstructive pulmonary disease, unspecified: Secondary | ICD-10-CM | POA: Diagnosis not present

## 2019-08-26 DIAGNOSIS — Z20828 Contact with and (suspected) exposure to other viral communicable diseases: Secondary | ICD-10-CM | POA: Diagnosis not present

## 2019-08-29 DIAGNOSIS — R05 Cough: Secondary | ICD-10-CM | POA: Diagnosis not present

## 2019-08-29 DIAGNOSIS — Z20828 Contact with and (suspected) exposure to other viral communicable diseases: Secondary | ICD-10-CM | POA: Diagnosis not present

## 2019-08-29 DIAGNOSIS — J449 Chronic obstructive pulmonary disease, unspecified: Secondary | ICD-10-CM | POA: Diagnosis not present

## 2019-08-30 DIAGNOSIS — R093 Abnormal sputum: Secondary | ICD-10-CM | POA: Diagnosis not present

## 2019-08-30 DIAGNOSIS — Z20828 Contact with and (suspected) exposure to other viral communicable diseases: Secondary | ICD-10-CM | POA: Diagnosis not present

## 2019-08-30 DIAGNOSIS — R0981 Nasal congestion: Secondary | ICD-10-CM | POA: Diagnosis not present

## 2019-08-30 DIAGNOSIS — R5383 Other fatigue: Secondary | ICD-10-CM | POA: Diagnosis not present

## 2019-08-30 DIAGNOSIS — J449 Chronic obstructive pulmonary disease, unspecified: Secondary | ICD-10-CM | POA: Diagnosis not present

## 2019-08-31 DIAGNOSIS — G301 Alzheimer's disease with late onset: Secondary | ICD-10-CM | POA: Diagnosis not present

## 2019-08-31 DIAGNOSIS — F064 Anxiety disorder due to known physiological condition: Secondary | ICD-10-CM | POA: Diagnosis not present

## 2019-08-31 DIAGNOSIS — F331 Major depressive disorder, recurrent, moderate: Secondary | ICD-10-CM | POA: Diagnosis not present

## 2019-08-31 DIAGNOSIS — F0281 Dementia in other diseases classified elsewhere with behavioral disturbance: Secondary | ICD-10-CM | POA: Diagnosis not present

## 2019-09-03 DIAGNOSIS — G309 Alzheimer's disease, unspecified: Secondary | ICD-10-CM | POA: Diagnosis not present

## 2019-09-03 DIAGNOSIS — I10 Essential (primary) hypertension: Secondary | ICD-10-CM | POA: Diagnosis not present

## 2019-09-03 DIAGNOSIS — F419 Anxiety disorder, unspecified: Secondary | ICD-10-CM | POA: Diagnosis not present

## 2019-09-03 DIAGNOSIS — J449 Chronic obstructive pulmonary disease, unspecified: Secondary | ICD-10-CM | POA: Diagnosis not present

## 2019-09-03 DIAGNOSIS — J441 Chronic obstructive pulmonary disease with (acute) exacerbation: Secondary | ICD-10-CM | POA: Diagnosis not present

## 2019-09-03 DIAGNOSIS — Z20828 Contact with and (suspected) exposure to other viral communicable diseases: Secondary | ICD-10-CM | POA: Diagnosis not present

## 2019-09-06 DIAGNOSIS — Z20828 Contact with and (suspected) exposure to other viral communicable diseases: Secondary | ICD-10-CM | POA: Diagnosis not present

## 2019-09-06 DIAGNOSIS — J449 Chronic obstructive pulmonary disease, unspecified: Secondary | ICD-10-CM | POA: Diagnosis not present

## 2019-09-09 DIAGNOSIS — Z20828 Contact with and (suspected) exposure to other viral communicable diseases: Secondary | ICD-10-CM | POA: Diagnosis not present

## 2019-09-09 DIAGNOSIS — J449 Chronic obstructive pulmonary disease, unspecified: Secondary | ICD-10-CM | POA: Diagnosis not present

## 2019-09-13 DIAGNOSIS — Z20828 Contact with and (suspected) exposure to other viral communicable diseases: Secondary | ICD-10-CM | POA: Diagnosis not present

## 2019-09-13 DIAGNOSIS — J449 Chronic obstructive pulmonary disease, unspecified: Secondary | ICD-10-CM | POA: Diagnosis not present

## 2019-09-14 DIAGNOSIS — F064 Anxiety disorder due to known physiological condition: Secondary | ICD-10-CM | POA: Diagnosis not present

## 2019-09-14 DIAGNOSIS — F331 Major depressive disorder, recurrent, moderate: Secondary | ICD-10-CM | POA: Diagnosis not present

## 2019-09-14 DIAGNOSIS — F0281 Dementia in other diseases classified elsewhere with behavioral disturbance: Secondary | ICD-10-CM | POA: Diagnosis not present

## 2019-09-14 DIAGNOSIS — G301 Alzheimer's disease with late onset: Secondary | ICD-10-CM | POA: Diagnosis not present

## 2019-09-16 DIAGNOSIS — Z20828 Contact with and (suspected) exposure to other viral communicable diseases: Secondary | ICD-10-CM | POA: Diagnosis not present

## 2019-09-16 DIAGNOSIS — J449 Chronic obstructive pulmonary disease, unspecified: Secondary | ICD-10-CM | POA: Diagnosis not present

## 2019-09-28 DIAGNOSIS — G301 Alzheimer's disease with late onset: Secondary | ICD-10-CM | POA: Diagnosis not present

## 2019-09-28 DIAGNOSIS — F0281 Dementia in other diseases classified elsewhere with behavioral disturbance: Secondary | ICD-10-CM | POA: Diagnosis not present

## 2019-09-28 DIAGNOSIS — F064 Anxiety disorder due to known physiological condition: Secondary | ICD-10-CM | POA: Diagnosis not present

## 2019-09-28 DIAGNOSIS — F331 Major depressive disorder, recurrent, moderate: Secondary | ICD-10-CM | POA: Diagnosis not present

## 2019-10-01 DIAGNOSIS — G309 Alzheimer's disease, unspecified: Secondary | ICD-10-CM | POA: Diagnosis not present

## 2019-10-01 DIAGNOSIS — J449 Chronic obstructive pulmonary disease, unspecified: Secondary | ICD-10-CM | POA: Diagnosis not present

## 2019-10-05 DIAGNOSIS — Z20822 Contact with and (suspected) exposure to covid-19: Secondary | ICD-10-CM | POA: Diagnosis not present

## 2019-10-05 DIAGNOSIS — J449 Chronic obstructive pulmonary disease, unspecified: Secondary | ICD-10-CM | POA: Diagnosis not present

## 2019-10-07 DIAGNOSIS — J449 Chronic obstructive pulmonary disease, unspecified: Secondary | ICD-10-CM | POA: Diagnosis not present

## 2019-10-07 DIAGNOSIS — Z20822 Contact with and (suspected) exposure to covid-19: Secondary | ICD-10-CM | POA: Diagnosis not present

## 2019-10-11 DIAGNOSIS — J449 Chronic obstructive pulmonary disease, unspecified: Secondary | ICD-10-CM | POA: Diagnosis not present

## 2019-10-11 DIAGNOSIS — Z20822 Contact with and (suspected) exposure to covid-19: Secondary | ICD-10-CM | POA: Diagnosis not present

## 2019-10-21 DIAGNOSIS — J449 Chronic obstructive pulmonary disease, unspecified: Secondary | ICD-10-CM | POA: Diagnosis not present

## 2019-10-21 DIAGNOSIS — Z20822 Contact with and (suspected) exposure to covid-19: Secondary | ICD-10-CM | POA: Diagnosis not present

## 2019-10-25 DIAGNOSIS — Z20822 Contact with and (suspected) exposure to covid-19: Secondary | ICD-10-CM | POA: Diagnosis not present

## 2019-10-25 DIAGNOSIS — J449 Chronic obstructive pulmonary disease, unspecified: Secondary | ICD-10-CM | POA: Diagnosis not present

## 2019-10-26 DIAGNOSIS — F331 Major depressive disorder, recurrent, moderate: Secondary | ICD-10-CM | POA: Diagnosis not present

## 2019-10-26 DIAGNOSIS — F0281 Dementia in other diseases classified elsewhere with behavioral disturbance: Secondary | ICD-10-CM | POA: Diagnosis not present

## 2019-10-26 DIAGNOSIS — F064 Anxiety disorder due to known physiological condition: Secondary | ICD-10-CM | POA: Diagnosis not present

## 2019-10-26 DIAGNOSIS — G301 Alzheimer's disease with late onset: Secondary | ICD-10-CM | POA: Diagnosis not present

## 2019-10-28 DIAGNOSIS — Z20822 Contact with and (suspected) exposure to covid-19: Secondary | ICD-10-CM | POA: Diagnosis not present

## 2019-10-28 DIAGNOSIS — J449 Chronic obstructive pulmonary disease, unspecified: Secondary | ICD-10-CM | POA: Diagnosis not present

## 2019-10-30 DIAGNOSIS — J449 Chronic obstructive pulmonary disease, unspecified: Secondary | ICD-10-CM | POA: Diagnosis not present

## 2019-10-30 DIAGNOSIS — G309 Alzheimer's disease, unspecified: Secondary | ICD-10-CM | POA: Diagnosis not present

## 2019-11-01 DIAGNOSIS — Z20822 Contact with and (suspected) exposure to covid-19: Secondary | ICD-10-CM | POA: Diagnosis not present

## 2019-11-01 DIAGNOSIS — J449 Chronic obstructive pulmonary disease, unspecified: Secondary | ICD-10-CM | POA: Diagnosis not present

## 2019-11-08 DIAGNOSIS — Z20822 Contact with and (suspected) exposure to covid-19: Secondary | ICD-10-CM | POA: Diagnosis not present

## 2019-11-08 DIAGNOSIS — J449 Chronic obstructive pulmonary disease, unspecified: Secondary | ICD-10-CM | POA: Diagnosis not present

## 2019-11-11 DIAGNOSIS — Z20822 Contact with and (suspected) exposure to covid-19: Secondary | ICD-10-CM | POA: Diagnosis not present

## 2019-11-11 DIAGNOSIS — J449 Chronic obstructive pulmonary disease, unspecified: Secondary | ICD-10-CM | POA: Diagnosis not present

## 2019-11-15 DIAGNOSIS — Z20822 Contact with and (suspected) exposure to covid-19: Secondary | ICD-10-CM | POA: Diagnosis not present

## 2019-11-15 DIAGNOSIS — J449 Chronic obstructive pulmonary disease, unspecified: Secondary | ICD-10-CM | POA: Diagnosis not present

## 2019-11-17 DIAGNOSIS — F411 Generalized anxiety disorder: Secondary | ICD-10-CM | POA: Diagnosis not present

## 2019-11-18 DIAGNOSIS — Z20822 Contact with and (suspected) exposure to covid-19: Secondary | ICD-10-CM | POA: Diagnosis not present

## 2019-11-18 DIAGNOSIS — J449 Chronic obstructive pulmonary disease, unspecified: Secondary | ICD-10-CM | POA: Diagnosis not present

## 2019-11-22 DIAGNOSIS — R279 Unspecified lack of coordination: Secondary | ICD-10-CM | POA: Diagnosis not present

## 2019-11-22 DIAGNOSIS — G8918 Other acute postprocedural pain: Secondary | ICD-10-CM | POA: Diagnosis not present

## 2019-11-22 DIAGNOSIS — R9431 Abnormal electrocardiogram [ECG] [EKG]: Secondary | ICD-10-CM | POA: Diagnosis not present

## 2019-11-22 DIAGNOSIS — E559 Vitamin D deficiency, unspecified: Secondary | ICD-10-CM | POA: Diagnosis not present

## 2019-11-22 DIAGNOSIS — M47812 Spondylosis without myelopathy or radiculopathy, cervical region: Secondary | ICD-10-CM | POA: Diagnosis not present

## 2019-11-22 DIAGNOSIS — J449 Chronic obstructive pulmonary disease, unspecified: Secondary | ICD-10-CM | POA: Diagnosis not present

## 2019-11-22 DIAGNOSIS — M5030 Other cervical disc degeneration, unspecified cervical region: Secondary | ICD-10-CM | POA: Diagnosis not present

## 2019-11-22 DIAGNOSIS — G309 Alzheimer's disease, unspecified: Secondary | ICD-10-CM | POA: Diagnosis not present

## 2019-11-22 DIAGNOSIS — S199XXA Unspecified injury of neck, initial encounter: Secondary | ICD-10-CM | POA: Diagnosis not present

## 2019-11-22 DIAGNOSIS — D72829 Elevated white blood cell count, unspecified: Secondary | ICD-10-CM | POA: Diagnosis not present

## 2019-11-22 DIAGNOSIS — I10 Essential (primary) hypertension: Secondary | ICD-10-CM | POA: Diagnosis not present

## 2019-11-22 DIAGNOSIS — F419 Anxiety disorder, unspecified: Secondary | ICD-10-CM | POA: Diagnosis not present

## 2019-11-22 DIAGNOSIS — T84428A Displacement of other internal orthopedic devices, implants and grafts, initial encounter: Secondary | ICD-10-CM | POA: Diagnosis not present

## 2019-11-22 DIAGNOSIS — Z66 Do not resuscitate: Secondary | ICD-10-CM | POA: Diagnosis not present

## 2019-11-22 DIAGNOSIS — F99 Mental disorder, not otherwise specified: Secondary | ICD-10-CM | POA: Diagnosis not present

## 2019-11-22 DIAGNOSIS — M6281 Muscle weakness (generalized): Secondary | ICD-10-CM | POA: Diagnosis not present

## 2019-11-22 DIAGNOSIS — R451 Restlessness and agitation: Secondary | ICD-10-CM | POA: Diagnosis not present

## 2019-11-22 DIAGNOSIS — R4182 Altered mental status, unspecified: Secondary | ICD-10-CM | POA: Diagnosis not present

## 2019-11-22 DIAGNOSIS — J189 Pneumonia, unspecified organism: Secondary | ICD-10-CM | POA: Diagnosis not present

## 2019-11-22 DIAGNOSIS — M47819 Spondylosis without myelopathy or radiculopathy, site unspecified: Secondary | ICD-10-CM | POA: Diagnosis not present

## 2019-11-22 DIAGNOSIS — S72141A Displaced intertrochanteric fracture of right femur, initial encounter for closed fracture: Secondary | ICD-10-CM | POA: Diagnosis not present

## 2019-11-22 DIAGNOSIS — E039 Hypothyroidism, unspecified: Secondary | ICD-10-CM | POA: Diagnosis not present

## 2019-11-22 DIAGNOSIS — S7221XA Displaced subtrochanteric fracture of right femur, initial encounter for closed fracture: Secondary | ICD-10-CM | POA: Diagnosis not present

## 2019-11-22 DIAGNOSIS — Z20822 Contact with and (suspected) exposure to covid-19: Secondary | ICD-10-CM | POA: Diagnosis not present

## 2019-11-22 DIAGNOSIS — R918 Other nonspecific abnormal finding of lung field: Secondary | ICD-10-CM | POA: Diagnosis not present

## 2019-11-22 DIAGNOSIS — F0390 Unspecified dementia without behavioral disturbance: Secondary | ICD-10-CM | POA: Diagnosis not present

## 2019-11-22 DIAGNOSIS — F0391 Unspecified dementia with behavioral disturbance: Secondary | ICD-10-CM | POA: Diagnosis not present

## 2019-11-22 DIAGNOSIS — M25551 Pain in right hip: Secondary | ICD-10-CM | POA: Diagnosis not present

## 2019-11-22 DIAGNOSIS — R5381 Other malaise: Secondary | ICD-10-CM | POA: Diagnosis not present

## 2019-11-22 DIAGNOSIS — J9811 Atelectasis: Secondary | ICD-10-CM | POA: Diagnosis not present

## 2019-11-22 DIAGNOSIS — S72144D Nondisplaced intertrochanteric fracture of right femur, subsequent encounter for closed fracture with routine healing: Secondary | ICD-10-CM | POA: Diagnosis not present

## 2019-11-27 DIAGNOSIS — R279 Unspecified lack of coordination: Secondary | ICD-10-CM | POA: Diagnosis not present

## 2019-11-27 DIAGNOSIS — S72141A Displaced intertrochanteric fracture of right femur, initial encounter for closed fracture: Secondary | ICD-10-CM | POA: Diagnosis not present

## 2019-11-27 DIAGNOSIS — R0989 Other specified symptoms and signs involving the circulatory and respiratory systems: Secondary | ICD-10-CM | POA: Diagnosis not present

## 2019-11-27 DIAGNOSIS — F0391 Unspecified dementia with behavioral disturbance: Secondary | ICD-10-CM | POA: Diagnosis not present

## 2019-11-27 DIAGNOSIS — R05 Cough: Secondary | ICD-10-CM | POA: Diagnosis not present

## 2019-11-27 DIAGNOSIS — Z20822 Contact with and (suspected) exposure to covid-19: Secondary | ICD-10-CM | POA: Diagnosis not present

## 2019-11-27 DIAGNOSIS — R5381 Other malaise: Secondary | ICD-10-CM | POA: Diagnosis not present

## 2019-11-27 DIAGNOSIS — E559 Vitamin D deficiency, unspecified: Secondary | ICD-10-CM | POA: Diagnosis not present

## 2019-11-27 DIAGNOSIS — I10 Essential (primary) hypertension: Secondary | ICD-10-CM | POA: Diagnosis not present

## 2019-11-27 DIAGNOSIS — E039 Hypothyroidism, unspecified: Secondary | ICD-10-CM | POA: Diagnosis not present

## 2019-11-27 DIAGNOSIS — F99 Mental disorder, not otherwise specified: Secondary | ICD-10-CM | POA: Diagnosis not present

## 2019-11-27 DIAGNOSIS — S7221XD Displaced subtrochanteric fracture of right femur, subsequent encounter for closed fracture with routine healing: Secondary | ICD-10-CM | POA: Diagnosis not present

## 2019-11-27 DIAGNOSIS — M6281 Muscle weakness (generalized): Secondary | ICD-10-CM | POA: Diagnosis not present

## 2019-11-27 DIAGNOSIS — D72829 Elevated white blood cell count, unspecified: Secondary | ICD-10-CM | POA: Diagnosis not present

## 2019-11-27 DIAGNOSIS — J449 Chronic obstructive pulmonary disease, unspecified: Secondary | ICD-10-CM | POA: Diagnosis not present

## 2019-11-27 DIAGNOSIS — J189 Pneumonia, unspecified organism: Secondary | ICD-10-CM | POA: Diagnosis not present

## 2019-11-27 DIAGNOSIS — S72144D Nondisplaced intertrochanteric fracture of right femur, subsequent encounter for closed fracture with routine healing: Secondary | ICD-10-CM | POA: Diagnosis not present

## 2019-11-27 DIAGNOSIS — F419 Anxiety disorder, unspecified: Secondary | ICD-10-CM | POA: Diagnosis not present

## 2019-11-27 DIAGNOSIS — G309 Alzheimer's disease, unspecified: Secondary | ICD-10-CM | POA: Diagnosis not present

## 2019-11-27 DIAGNOSIS — K5939 Other megacolon: Secondary | ICD-10-CM | POA: Diagnosis not present

## 2019-11-27 DIAGNOSIS — R093 Abnormal sputum: Secondary | ICD-10-CM | POA: Diagnosis not present

## 2019-11-27 DIAGNOSIS — R918 Other nonspecific abnormal finding of lung field: Secondary | ICD-10-CM | POA: Diagnosis not present

## 2019-11-30 DIAGNOSIS — R918 Other nonspecific abnormal finding of lung field: Secondary | ICD-10-CM | POA: Diagnosis not present

## 2019-11-30 DIAGNOSIS — K5939 Other megacolon: Secondary | ICD-10-CM | POA: Diagnosis not present

## 2019-11-30 DIAGNOSIS — R093 Abnormal sputum: Secondary | ICD-10-CM | POA: Diagnosis not present

## 2019-11-30 DIAGNOSIS — R05 Cough: Secondary | ICD-10-CM | POA: Diagnosis not present

## 2019-12-11 DIAGNOSIS — S7221XD Displaced subtrochanteric fracture of right femur, subsequent encounter for closed fracture with routine healing: Secondary | ICD-10-CM | POA: Diagnosis not present

## 2019-12-14 DIAGNOSIS — R918 Other nonspecific abnormal finding of lung field: Secondary | ICD-10-CM | POA: Diagnosis not present

## 2019-12-14 DIAGNOSIS — R0989 Other specified symptoms and signs involving the circulatory and respiratory systems: Secondary | ICD-10-CM | POA: Diagnosis not present

## 2020-01-01 ENCOUNTER — Other Ambulatory Visit: Payer: Self-pay

## 2020-01-01 DIAGNOSIS — J189 Pneumonia, unspecified organism: Secondary | ICD-10-CM | POA: Diagnosis not present

## 2020-01-01 DIAGNOSIS — S72141A Displaced intertrochanteric fracture of right femur, initial encounter for closed fracture: Secondary | ICD-10-CM | POA: Diagnosis not present

## 2020-01-01 DIAGNOSIS — E559 Vitamin D deficiency, unspecified: Secondary | ICD-10-CM | POA: Diagnosis not present

## 2020-01-01 DIAGNOSIS — E039 Hypothyroidism, unspecified: Secondary | ICD-10-CM | POA: Diagnosis not present

## 2020-01-01 DIAGNOSIS — J449 Chronic obstructive pulmonary disease, unspecified: Secondary | ICD-10-CM | POA: Diagnosis not present

## 2020-01-01 DIAGNOSIS — I10 Essential (primary) hypertension: Secondary | ICD-10-CM | POA: Diagnosis not present

## 2020-01-01 DIAGNOSIS — G309 Alzheimer's disease, unspecified: Secondary | ICD-10-CM | POA: Diagnosis not present

## 2020-01-01 NOTE — Patient Outreach (Signed)
[facility]) Care Management  01/01/2020  Jordain Radin Deshazo November 15, 1940 [id number]   Referral Date: 01/01/20 Referral Source:  [employer] Report Date of Admission: unknown Diagnosis: unknown Date of Discharge: 12/31/19 Facility:  [facility] Insurance: [facility]  Referral received. No outreach warranted at this time. Transition of Care  will be completed by primary care provider office who will refer to [facility] care management if needed.  Plan: RN CM will close case.    Bary Leriche, RN, MSN [facility] Care Management Care Management Coordinator Direct Line [phone] Toll Free: [phone]  Fax: [phone]

## 2020-01-06 DIAGNOSIS — S7221XD Displaced subtrochanteric fracture of right femur, subsequent encounter for closed fracture with routine healing: Secondary | ICD-10-CM | POA: Diagnosis not present

## 2020-01-06 DIAGNOSIS — S7291XD Unspecified fracture of right femur, subsequent encounter for closed fracture with routine healing: Secondary | ICD-10-CM | POA: Diagnosis not present

## 2020-01-09 DIAGNOSIS — J189 Pneumonia, unspecified organism: Secondary | ICD-10-CM | POA: Diagnosis not present

## 2020-01-18 DIAGNOSIS — F064 Anxiety disorder due to known physiological condition: Secondary | ICD-10-CM | POA: Diagnosis not present

## 2020-01-18 DIAGNOSIS — G301 Alzheimer's disease with late onset: Secondary | ICD-10-CM | POA: Diagnosis not present

## 2020-01-18 DIAGNOSIS — F0281 Dementia in other diseases classified elsewhere with behavioral disturbance: Secondary | ICD-10-CM | POA: Diagnosis not present

## 2020-01-18 DIAGNOSIS — F331 Major depressive disorder, recurrent, moderate: Secondary | ICD-10-CM | POA: Diagnosis not present

## 2020-02-23 DEATH — deceased
